# Patient Record
Sex: Male | Born: 1985 | Race: Black or African American | Hispanic: No | Marital: Single | Smoking: Current every day smoker
Health system: Southern US, Community
[De-identification: ages and names within clinical notes are randomized; demographics above are authoritative.]

## PROBLEM LIST (undated history)

## (undated) DIAGNOSIS — I10 Essential (primary) hypertension: Secondary | ICD-10-CM

---

## 2010-01-16 ENCOUNTER — Emergency Department (HOSPITAL_COMMUNITY): Admission: EM | Admit: 2010-01-16 | Discharge: 2010-01-16 | Payer: Self-pay | Admitting: Family Medicine

## 2010-08-02 LAB — POCT RAPID STREP A (OFFICE): Streptococcus, Group A Screen (Direct): NEGATIVE

## 2010-08-02 LAB — CBC
HCT: 42.5 % (ref 39.0–52.0)
Hemoglobin: 14.8 g/dL (ref 13.0–17.0)
WBC: 14.2 10*3/uL — ABNORMAL HIGH (ref 4.0–10.5)

## 2010-08-02 LAB — DIFFERENTIAL
Eosinophils Relative: 1 % (ref 0–5)
Lymphocytes Relative: 31 % (ref 12–46)
Lymphs Abs: 4.4 10*3/uL — ABNORMAL HIGH (ref 0.7–4.0)
Monocytes Absolute: 1.4 10*3/uL — ABNORMAL HIGH (ref 0.1–1.0)
Monocytes Relative: 10 % (ref 3–12)
Neutro Abs: 8.3 10*3/uL — ABNORMAL HIGH (ref 1.7–7.7)

## 2010-08-02 LAB — COMPREHENSIVE METABOLIC PANEL
AST: 28 U/L (ref 0–37)
Albumin: 3.8 g/dL (ref 3.5–5.2)
BUN: 8 mg/dL (ref 6–23)
Calcium: 9.6 mg/dL (ref 8.4–10.5)
Chloride: 105 mEq/L (ref 96–112)
Creatinine, Ser: 1.07 mg/dL (ref 0.4–1.5)
Total Protein: 7.3 g/dL (ref 6.0–8.3)

## 2016-07-30 ENCOUNTER — Emergency Department
Admission: EM | Admit: 2016-07-30 | Discharge: 2016-07-30 | Disposition: A | Discharge status: Home / Self Care | Payer: Self-pay | Attending: Emergency Medicine | Admitting: Emergency Medicine

## 2016-07-30 DIAGNOSIS — R369 Urethral discharge, unspecified: Secondary | ICD-10-CM | POA: Insufficient documentation

## 2016-07-30 DIAGNOSIS — Z202 Contact with and (suspected) exposure to infections with a predominantly sexual mode of transmission: Secondary | ICD-10-CM | POA: Insufficient documentation

## 2016-07-30 DIAGNOSIS — R300 Dysuria: Secondary | ICD-10-CM | POA: Insufficient documentation

## 2016-07-30 LAB — URINALYSIS, COMPLETE (UACMP) WITH MICROSCOPIC
Bacteria, UA: NONE SEEN
Bilirubin Urine: NEGATIVE
Glucose, UA: NEGATIVE mg/dL
Hgb urine dipstick: NEGATIVE
Ketones, ur: NEGATIVE mg/dL
Nitrite: NEGATIVE
Protein, ur: NEGATIVE mg/dL
Specific Gravity, Urine: 1.024 (ref 1.005–1.030)
Squamous Epithelial / HPF: NONE SEEN
pH: 7 (ref 5.0–8.0)

## 2016-07-30 MED ORDER — AZITHROMYCIN 500 MG PO TABS
1000.0000 mg | ORAL_TABLET | Freq: Once | ORAL | Status: AC
  Administered 2016-07-30: 1000 mg via ORAL
  Filled 2016-07-30: qty 2

## 2016-07-30 MED ORDER — CEFTRIAXONE SODIUM 250 MG IJ SOLR
250.0000 mg | Freq: Once | INTRAMUSCULAR | Status: AC
  Administered 2016-07-30: 250 mg via INTRAMUSCULAR
  Filled 2016-07-30: qty 250

## 2016-07-30 MED ORDER — AZITHROMYCIN 500 MG PO TABS
ORAL_TABLET | ORAL | Status: AC
  Filled 2016-07-30: qty 2

## 2016-07-30 NOTE — ED Notes (Signed)
Pt states x 1-2 days burning while peeing and white discharge. States sexual partner told him that they had an STD and for pt to get checked out.   Pt ambulatory to toilet to collect urine specimen.

## 2016-07-30 NOTE — ED Triage Notes (Signed)
Pt c/o penile discharge, states someone he was with told him they had an STD but did not say which one

## 2016-07-31 LAB — CHLAMYDIA/NGC RT PCR (ARMC ONLY)
Chlamydia Tr: NOT DETECTED
N gonorrhoeae: NOT DETECTED

## 2016-08-01 NOTE — ED Provider Notes (Signed)
[facility] Emergency Department Provider Note  ____________________________________________  Time seen: Approximately 12:34 AM  I have reviewed the triage vital signs and the nursing notes.   HISTORY  Chief Complaint Exposure to STD    HPI Ricardo Caldwell is a 31 y.o. male presenting with penile discharge and dysuria for the past 2 days. Patient has a history of sexual promiscuity and unprotected sex. Patient has been previously diagnosed with Chlamydia. He denies fever, bilateral flank pain, hematuria and history of pyelonephritis. No alleviating measures have been attempted. Patient denies chest pain, chest tightness, nausea, abdominal pain and vomiting.   History reviewed. No pertinent past medical history.  There are no active problems to display for this patient.   History reviewed. No pertinent surgical history.  Prior to Admission medications   Not on File    Allergies Patient has no known allergies.  No family history on file.  Social History Social History  Substance Use Topics  . Smoking status: Never Smoker  . Smokeless tobacco: Never Used  . Alcohol use No     Review of Systems  Constitutional: No fever/chills Eyes: No visual changes. No discharge ENT: No upper respiratory complaints. Cardiovascular: no chest pain. Respiratory: no cough. No SOB. Gastrointestinal: No abdominal pain.  No nausea, no vomiting.  No diarrhea. No constipation. Genitourinary: Patient has dysuria and penile discharge. Musculoskeletal: Negative for musculoskeletal pain. Skin: Negative for rash, abrasions, lacerations, ecchymosis. Neurological: Negative for headaches, focal weakness or numbness.   ____________________________________________   PHYSICAL EXAM:  VITAL SIGNS: ED Triage Vitals [07/30/16 1755]  Enc Vitals Group     BP (!) 151/84     Pulse Rate 64     Resp 18     Temp 98.5 F (36.9 C)     Temp Source Oral     SpO2 98 %      Weight 250 lb (113.4 kg)     Height 5\' 10"  (1.778 m)     Head Circumference      Peak Flow      Pain Score 0     Pain Loc      Pain Edu?      Excl. in GC?      Constitutional: Alert and oriented. Well appearing and in no acute distress. Eyes: Conjunctivae are normal. PERRL. EOMI. Head: Atraumatic. Cardiovascular: Normal rate, regular rhythm. Normal S1 and S2.  Good peripheral circulation. Respiratory: Normal respiratory effort without tachypnea or retractions. Lungs CTAB. Good air entry to the bases with no decreased or absent breath sounds. Gastrointestinal: Bowel sounds 4 quadrants. Soft and nontender to palpation. No guarding or rigidity. No palpable masses. No distention. No CVA tenderness. Genitourinary:  No erythema of the urethral meatus. No strictures. Musculoskeletal: Full range of motion to all extremities. No gross deformities appreciated. Neurologic:  Normal speech and language. No gross focal neurologic deficits are appreciated.  Skin:  Skin is warm, dry and intact. No rash noted. Psychiatric: Mood and affect are normal. Speech and behavior are normal. Patient exhibits appropriate insight and judgement.   ____________________________________________   LABS (all labs ordered are listed, but only abnormal results are displayed)  Labs Reviewed  URINALYSIS, COMPLETE (UACMP) WITH MICROSCOPIC - Abnormal; Notable for the following:       Result Value   Color, Urine YELLOW (*)    APPearance HAZY (*)    Leukocytes, UA MODERATE (*)    All other components within normal limits  CHLAMYDIA/NGC RT PCR ([facility] ONLY)  ____________________________________________  EKG   ____________________________________________  RADIOLOGY   No results found.  ____________________________________________    PROCEDURES  Procedure(s) performed:    Procedures    Medications  cefTRIAXone (ROCEPHIN) injection 250 mg (250 mg Intramuscular Given 07/30/16 2008)   azithromycin (ZITHROMAX) tablet 1,000 mg (1,000 mg Oral Given 07/30/16 2008)     ____________________________________________   INITIAL IMPRESSION / ASSESSMENT AND PLAN / ED COURSE  Pertinent labs & imaging results that were available during my care of the patient were reviewed by me and considered in my medical decision making (see chart for details).  Review of the [place] CSRS was performed in accordance of the [facility] prior to dispensing any controlled drugs.     Assessment and plan: STD exposure: Patient presents to the emergency department with dysuria and penile discharge. Patient has a history of sexual promiscuity and unprotected sex. Patient education was provided in the emergency department regarding safe sex practices. Patient was treated empirically with azithromycin and ceftriaxone. Patient was advised to follow-up at the health department for additional STD testing. All patient questions were answered.     ____________________________________________  FINAL CLINICAL IMPRESSION(S) / ED DIAGNOSES  Final diagnoses:  STD exposure      NEW MEDICATIONS STARTED DURING THIS VISIT:  There are no discharge medications for this patient.       This chart was dictated using voice recognition software/Dragon. Despite best efforts to proofread, errors can occur which can change the meaning. Any change was purely unintentional.    Orvil FeilJaclyn M Tyyonna Soucy, PA-C 08/01/16 [id number]    Phineas SemenGraydon Goodman, MD 08/02/16 [phone]

## 2016-12-05 ENCOUNTER — Encounter (HOSPITAL_COMMUNITY): Payer: Self-pay | Admitting: Emergency Medicine

## 2016-12-05 ENCOUNTER — Emergency Department (HOSPITAL_COMMUNITY)
Admission: EM | Admit: 2016-12-05 | Discharge: 2016-12-05 | Disposition: A | Discharge status: Home / Self Care | Payer: Self-pay | Attending: Emergency Medicine | Admitting: Emergency Medicine

## 2016-12-05 DIAGNOSIS — R300 Dysuria: Secondary | ICD-10-CM | POA: Insufficient documentation

## 2016-12-05 DIAGNOSIS — F17200 Nicotine dependence, unspecified, uncomplicated: Secondary | ICD-10-CM | POA: Insufficient documentation

## 2016-12-05 DIAGNOSIS — Z711 Person with feared health complaint in whom no diagnosis is made: Secondary | ICD-10-CM | POA: Insufficient documentation

## 2016-12-05 LAB — URINALYSIS, ROUTINE W REFLEX MICROSCOPIC
BILIRUBIN URINE: NEGATIVE
Glucose, UA: NEGATIVE mg/dL
Hgb urine dipstick: NEGATIVE
Ketones, ur: NEGATIVE mg/dL
NITRITE: NEGATIVE
PH: 6 (ref 5.0–8.0)
Protein, ur: NEGATIVE mg/dL
SPECIFIC GRAVITY, URINE: 1.008 (ref 1.005–1.030)

## 2016-12-05 MED ORDER — AZITHROMYCIN 250 MG PO TABS
1000.0000 mg | ORAL_TABLET | Freq: Once | ORAL | Status: AC
  Administered 2016-12-05: 1000 mg via ORAL
  Filled 2016-12-05: qty 4

## 2016-12-05 MED ORDER — CEFTRIAXONE SODIUM 250 MG IJ SOLR
250.0000 mg | Freq: Once | INTRAMUSCULAR | Status: AC
  Administered 2016-12-05: 250 mg via INTRAMUSCULAR
  Filled 2016-12-05: qty 250

## 2016-12-05 MED ORDER — DOXYCYCLINE HYCLATE 100 MG PO CAPS: 100.0000 mg | ORAL_CAPSULE | Freq: Two times a day (BID) | ORAL | 0 refills | Status: DC

## 2016-12-05 MED ORDER — LIDOCAINE HCL 1 % IJ SOLN
INTRAMUSCULAR | Status: AC
Start: 2016-12-05 — End: 2016-12-05
  Administered 2016-12-05: 20 mL
  Filled 2016-12-05: qty 20

## 2016-12-05 NOTE — ED Triage Notes (Signed)
Pt comes in with complaints of burning upon urination and his stomach being upset.  Pt reports hx of UTI and this feels similar. Reports no changes in the color of his urine. Pt also reports having sex recently (hx of STI's) but was wearing a condom so is doubtful that is related.

## 2016-12-05 NOTE — ED Provider Notes (Signed)
[facility]-EMERGENCY DEPT Provider Note   CSN: [id number] Arrival date & time: 12/05/16  1331     History   Chief Complaint Chief Complaint  Patient presents with  . Dysuria    HPI Ricardo AndersonMichael Caldwell is a 31 y.o. male.  HPI   31 year old male presents with urinary discomfort. Patient states he had protected sex 3 days ago with the opposite sex. Yesterday he noticed some mild urinary discomfort with tingling sensation at the tip of his penis and some mild discharge. Does report some vague abdominal discomfort but states symptom is mild. He felt that he may have a urinary tract infection and would like to be tested for that. He also would like to be tested for gonorrhea and chlamydia as he has history in the past. He does not want to be tested for HIV and syphilis. He has no known drug allergies. He has had 3 separate sexual partners within the past 6 months. Currently denies having fever, chills, back pain, testicular pain, scrotal swelling, rectal pain, or rash. He denies any specific treatment tried.  History reviewed. No pertinent past medical history.  There are no active problems to display for this patient.   History reviewed. No pertinent surgical history.     Home Medications    Prior to Admission medications   Not on File    Family History No family history on file.  Social History Social History  Substance Use Topics  . Smoking status: Current Every Day Smoker    Packs/day: 0.50  . Smokeless tobacco: Never Used  . Alcohol use No     Allergies   Patient has no known allergies.   Review of Systems Review of Systems  Constitutional: Negative for fever.  Genitourinary: Positive for discharge and dysuria. Negative for penile pain, penile swelling, scrotal swelling and testicular pain.  Musculoskeletal: Negative for back pain.  Neurological: Negative for numbness and headaches.     Physical Exam Updated Vital Signs BP (!) 158/95 (BP Location: Right Arm)    Pulse 74   Temp 98.6 F (37 C) (Oral)   Resp 20   Ht 5\' 10"  (1.778 m)   Wt 97.5 kg (215 lb)   SpO2 99%   BMI 30.85 kg/m   Physical Exam  Constitutional: He appears well-developed and well-nourished. No distress.  HENT:  Head: Atraumatic.  Eyes: Conjunctivae are normal.  Neck: Neck supple.  Abdominal: Soft. He exhibits no distension. There is no tenderness.  Genitourinary:  Genitourinary Comments: Chaperone present during exam. No inguinal lymph biopsy or inguinal hernia noted. Normal  uncircumcised penis free of lesion or rash. testicle is normal with normal lie, nontender. Normal scrotum. Perineum is soft and nontender.   Neurological: He is alert.  Skin: No rash noted.  Psychiatric: He has a normal mood and affect.  Nursing note and vitals reviewed.    ED Treatments / Results  Labs (all labs ordered are listed, but only abnormal results are displayed) Labs Reviewed  URINALYSIS, ROUTINE W REFLEX MICROSCOPIC - Abnormal; Notable for the following:       Result Value   Leukocytes, UA SMALL (*)    Bacteria, UA FEW (*)    Squamous Epithelial / LPF 0-5 (*)    All other components within normal limits    EKG  EKG Interpretation None       Radiology No results found.  Procedures Procedures (including critical care time)  Medications Ordered in ED Medications  cefTRIAXone (ROCEPHIN) injection 250 mg (not administered)  azithromycin (ZITHROMAX) tablet 1,000 mg (not administered)     Initial Impression / Assessment and Plan / ED Course  I have reviewed the triage vital signs and the nursing notes.  Pertinent labs & imaging results that were available during my care of the patient were reviewed by me and considered in my medical decision making (see chart for details).     BP (!) 158/95 (BP Location: Right Arm)   Pulse 74   Temp 98.6 F (37 C) (Oral)   Resp 20   Ht 5\' 10"  (1.778 m)   Wt 97.5 kg (215 lb)   SpO2 99%   BMI 30.85 kg/m    Final Clinical  Impressions(s) / ED Diagnoses   Final diagnoses:  Dysuria  Concern about STD in male without diagnosis    New Prescriptions New Prescriptions   DOXYCYCLINE (VIBRAMYCIN) 100 MG CAPSULE    Take 1 capsule (100 mg total) by mouth 2 (two) times daily. One po bid x 7 days   2:59 PM Pt here with concern for potential STI after he developed dysuria and penile discharge.  Pt would like to be tested for GC/Ch but does not want to be tested for HIV/Syphillis.  He has a benign abdominal exam.    3:13 PM Patient has a normal genital exam. GC and Chlamydia is obtained. Patient given Rocephin and Zithromax. Since he endorsed dysuria with frequency and urgency and therefore I will prescribe doxycycline to treat for potential UTI as well. Encouraged patient to notify partner if tested positive for infection.   Fayrene Helper, PA-C 12/05/16 1528    Shaune Pollack, MD 12/06/16 [phone]

## 2016-12-05 NOTE — ED Notes (Signed)
Waited 30 minutes after administration of IM injection.  Verbalized understanding of discharge instructions.

## 2016-12-05 NOTE — Discharge Instructions (Signed)
Take antibiotic as prescribed for the dull duration.  Avoid sexual activities until your condition resolved.  Notify partner if you tested positive for infection.

## 2016-12-08 LAB — GC/CHLAMYDIA PROBE AMP (~~LOC~~) NOT AT ARMC
Chlamydia: NEGATIVE
NEISSERIA GONORRHEA: NEGATIVE

## 2018-02-13 ENCOUNTER — Other Ambulatory Visit: Payer: Self-pay

## 2018-02-13 ENCOUNTER — Emergency Department (HOSPITAL_COMMUNITY)
Admission: EM | Admit: 2018-02-13 | Discharge: 2018-02-13 | Disposition: A | Discharge status: Home / Self Care | Payer: Self-pay | Attending: Emergency Medicine | Admitting: Emergency Medicine

## 2018-02-13 ENCOUNTER — Encounter (HOSPITAL_COMMUNITY): Payer: Self-pay | Admitting: *Deleted

## 2018-02-13 DIAGNOSIS — I10 Essential (primary) hypertension: Secondary | ICD-10-CM | POA: Insufficient documentation

## 2018-02-13 DIAGNOSIS — G4489 Other headache syndrome: Secondary | ICD-10-CM | POA: Insufficient documentation

## 2018-02-13 DIAGNOSIS — F17210 Nicotine dependence, cigarettes, uncomplicated: Secondary | ICD-10-CM | POA: Insufficient documentation

## 2018-02-13 LAB — I-STAT CHEM 8, ED
BUN: 9 mg/dL (ref 6–20)
Calcium, Ion: 1.1 mmol/L — ABNORMAL LOW (ref 1.15–1.40)
Chloride: 103 mmol/L (ref 98–111)
Creatinine, Ser: 1 mg/dL (ref 0.61–1.24)
Glucose, Bld: 95 mg/dL (ref 70–99)
HCT: 42 % (ref 39.0–52.0)
Hemoglobin: 14.3 g/dL (ref 13.0–17.0)
Potassium: 3.5 mmol/L (ref 3.5–5.1)
Sodium: 141 mmol/L (ref 135–145)
TCO2: 29 mmol/L (ref 22–32)

## 2018-02-13 MED ORDER — HYDROCHLOROTHIAZIDE 12.5 MG PO TABS: 12.5000 mg | ORAL_TABLET | Freq: Every day | ORAL | 0 refills | Status: DC

## 2018-02-13 MED ORDER — IBUPROFEN 200 MG PO TABS
400.0000 mg | ORAL_TABLET | Freq: Once | ORAL | Status: AC
  Administered 2018-02-13: 400 mg via ORAL
  Filled 2018-02-13: qty 2

## 2018-02-13 NOTE — ED Triage Notes (Signed)
Pt reports he has had headache and dizziness for about 1 week.

## 2018-02-13 NOTE — ED Notes (Signed)
Patient states he has been having dizzy spells with near syncopal episodes with an associated headache to bilateral temples for about 1 week. Patient states he is a single father and he didn't want to pass out with his daughter.

## 2018-02-13 NOTE — ED Provider Notes (Signed)
[facility]-EMERGENCY DEPT Provider Note   CSN: [id number] Arrival date & time: 02/13/18  0257     History   Chief Complaint Chief Complaint  Patient presents with  . Headache    HPI Ricardo Caldwell is a 32 y.o. male.  The history is provided by the patient.  Headache   This is a new problem. The current episode started more than 2 days ago. The problem occurs constantly. The problem has not changed since onset.The pain is located in the left unilateral and right unilateral region. The pain is moderate. The pain does not radiate. Associated symptoms include nausea. Pertinent negatives include no fever and no vomiting. He has tried nothing for the symptoms.   Patient presents with headache.  He reports for the past week he had gradual onset of headache.  No fevers or vomiting.  He did report some lightheadedness, but no syncope.  No focal weakness. No recent falls or injuries.   PMH-none Soc hx - occasional smoker Home Medications    Prior to Admission medications   Medication Sig Start Date End Date Taking? Authorizing Provider  hydrochlorothiazide (HYDRODIURIL) 12.5 MG tablet Take 1 tablet (12.5 mg total) by mouth daily. 02/13/18   Zadie Rhine, MD    Family History No family history on file.  Social History Social History   Tobacco Use  . Smoking status: Current Every Day Smoker    Packs/day: 0.50  . Smokeless tobacco: Never Used  Substance Use Topics  . Alcohol use: No  . Drug use: Not on file     Allergies   Patient has no known allergies.   Review of Systems Review of Systems  Constitutional: Negative for fever.  Gastrointestinal: Positive for nausea. Negative for vomiting.  Neurological: Positive for light-headedness and headaches. Negative for weakness.  All other systems reviewed and are negative.    Physical Exam Updated Vital Signs BP (!) 149/114 (BP Location: Right Arm)   Pulse 70   Temp 97.9 F (36.6 C) (Oral)    Resp 16   Ht 1.829 m (6')   Wt 102.1 kg   SpO2 100%   BMI 30.52 kg/m   Physical Exam CONSTITUTIONAL: Well developed/well nourished HEAD: Normocephalic/atraumatic EYES: EOMI/PERRL ENMT: Mucous membranes moist NECK: supple no meningeal signs CV: S1/S2 noted, no murmurs/rubs/gallops noted LUNGS: Lungs are clear to auscultation bilaterally, no apparent distress ABDOMEN: soft, nontender NEURO: Pt is awake/alert/appropriate, moves all extremitiesx4.  No facial droop.   EXTREMITIES: pulses normal/equal, full ROM SKIN: warm, color normal PSYCH: no abnormalities of mood noted, alert and oriented to situation   ED Treatments / Results  Labs (all labs ordered are listed, but only abnormal results are displayed) Labs Reviewed  I-STAT CHEM 8, ED - Abnormal; Notable for the following components:      Result Value   Calcium, Ion 1.10 (*)    All other components within normal limits    EKG None  Radiology No results found.  Procedures Procedures    Medications Ordered in ED Medications  ibuprofen (ADVIL,MOTRIN) tablet 400 mg (400 mg Oral Given 02/13/18 0521)     Initial Impression / Assessment and Plan / ED Course  I have reviewed the triage vital signs and the nursing notes.  Pertinent labs results that were available during my care of the patient were reviewed by me and considered in my medical decision making (see chart for details).     5:52 AM Patient reports had a headache for about a week.  It was very gradual in onset type headache, with associated lightheadedness No focal weakness.  He is in no distress.  He reports he has been told previously that he has high blood pressure, but not on current medications.  After long discussion with patient, likely that his headache is due to elevated blood pressure.  He has no follow-up plan in place, and he is interested in starting medications.  Labs drawn were unremarkable, therefore we will start HCTZ for 2 weeks. He has been  referred to [facility] health/wellness. PT feels comfortable with plan. Final Clinical Impressions(s) / ED Diagnoses   Final diagnoses:  Other headache syndrome  Essential hypertension    ED Discharge Orders         Ordered    hydrochlorothiazide (HYDRODIURIL) 12.5 MG tablet  Daily     02/13/18 0541           Zadie Rhine, MD 02/13/18 [phone]

## 2018-04-23 ENCOUNTER — Emergency Department
Admission: EM | Admit: 2018-04-23 | Discharge: 2018-04-23 | Disposition: A | Discharge status: Home / Self Care | Payer: Self-pay | Attending: Emergency Medicine | Admitting: Emergency Medicine

## 2018-04-23 DIAGNOSIS — B349 Viral infection, unspecified: Secondary | ICD-10-CM | POA: Insufficient documentation

## 2018-04-23 DIAGNOSIS — Z79899 Other long term (current) drug therapy: Secondary | ICD-10-CM | POA: Insufficient documentation

## 2018-04-23 DIAGNOSIS — R42 Dizziness and giddiness: Secondary | ICD-10-CM | POA: Insufficient documentation

## 2018-04-23 LAB — GLUCOSE, CAPILLARY: Glucose-Capillary: 130 mg/dL — ABNORMAL HIGH (ref 70–99)

## 2018-04-23 LAB — CBC
HEMATOCRIT: 42.8 % (ref 39.0–52.0)
Hemoglobin: 13.7 g/dL (ref 13.0–17.0)
MCH: 28.2 pg (ref 26.0–34.0)
MCHC: 32 g/dL (ref 30.0–36.0)
MCV: 88.2 fL (ref 80.0–100.0)
NRBC: 0 % (ref 0.0–0.2)
PLATELETS: 331 10*3/uL (ref 150–400)
RBC: 4.85 MIL/uL (ref 4.22–5.81)
RDW: 13.3 % (ref 11.5–15.5)
WBC: 12.2 10*3/uL — ABNORMAL HIGH (ref 4.0–10.5)

## 2018-04-23 LAB — BASIC METABOLIC PANEL
ANION GAP: 6 (ref 5–15)
BUN: 10 mg/dL (ref 6–20)
CALCIUM: 8.6 mg/dL — AB (ref 8.9–10.3)
CHLORIDE: 106 mmol/L (ref 98–111)
CO2: 28 mmol/L (ref 22–32)
Creatinine, Ser: 1.04 mg/dL (ref 0.61–1.24)
GFR calc non Af Amer: >60 mL/min (ref >60)
Glucose, Bld: 130 mg/dL — ABNORMAL HIGH (ref 70–99)
Potassium: 3.4 mmol/L — ABNORMAL LOW (ref 3.5–5.1)
Sodium: 140 mmol/L (ref 135–145)

## 2018-04-23 NOTE — ED Provider Notes (Signed)
[facility] Emergency Department Provider Note  ____________________________________________   First MD Initiated Contact with Patient 04/23/18 [phone]     (approximate)  I have reviewed the triage vital signs and the nursing notes.   HISTORY  Chief Complaint Dizziness    HPI Ricardo Caldwell is a 32 y.o. male who denies any chronic medical issues who presents by private vehicle for evaluation of about 3 weeks or longer of dizziness.  He feels it mostly when he stands up too suddenly and is acute in onset and can be severe but resolves after a few seconds.  Nothing particular makes it better.  He says that over the last week or so he is also had some general malaise, generalized body aches, nasal congestion, and mild cough.  No difficulty breathing.  Denies chest pain, nausea, vomiting, and abdominal pain.  He is not having any difficulty with ambulation and after he stands that if he remains still for a short period of time that he feels better.  He does not have a primary care doctor and has not seen anybody about this.  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  He has no difficulty with coordination.  History reviewed. No pertinent past medical history.  There are no active problems to display for this patient.   History reviewed. No pertinent surgical history.  Prior to Admission medications   Medication Sig Start Date End Date Taking? Authorizing Provider  hydrochlorothiazide (HYDRODIURIL) 12.5 MG tablet Take 1 tablet (12.5 mg total) by mouth daily. 02/13/18   Zadie Rhine, MD    Allergies Patient has no known allergies.  No family history on file.  Social History Social History   Tobacco Use  . Smoking status: Current Every Day Smoker    Packs/day: 0.50  . Smokeless tobacco: Never Used  Substance Use Topics  . Alcohol use: No  . Drug use: Not on file    Review of Systems Constitutional: No fever/chills Eyes: No  visual changes. ENT: No sore throat.  Nasal congestion and runny nose. Cardiovascular: Denies chest pain. Respiratory: Denies shortness of breath.  Mild recent cough. Gastrointestinal: No abdominal pain.  No nausea, no vomiting.  No diarrhea.  No constipation. Genitourinary: Negative for dysuria. Musculoskeletal: Negative for neck pain.  Negative for back pain. Integumentary: Negative for rash. Neurological: Dizziness upon standing for 3 or more weeks.  Negative for headaches, focal weakness or numbness.   ____________________________________________   PHYSICAL EXAM:  VITAL SIGNS: ED Triage Vitals  Enc Vitals Group     BP 04/23/18 0026 (!) 142/76     Pulse Rate 04/23/18 0026 66     Resp 04/23/18 0026 18     Temp 04/23/18 0026 98.4 F (36.9 C)     Temp Source 04/23/18 0026 Oral     SpO2 04/23/18 0026 97 %     Weight 04/23/18 0024 102 kg (224 lb 13.9 oz)     Height --      Head Circumference --      Peak Flow --      Pain Score 04/23/18 0024 0     Pain Loc --      Pain Edu? --      Excl. in GC? --     Constitutional: Alert and oriented. Well appearing and in no acute distress. Eyes: Conjunctivae are normal.  Head: Atraumatic. Nose: No congestion/rhinnorhea. Mouth/Throat: Mucous membranes are moist. Neck: No stridor.  No meningeal signs.  Cardiovascular: Normal rate, regular rhythm. Good peripheral circulation. Grossly normal heart sounds. Respiratory: Normal respiratory effort.  No retractions. Lungs CTAB. Gastrointestinal: Soft and nontender. No distention.  Musculoskeletal: No lower extremity tenderness nor edema. No gross deformities of extremities. Neurologic:  Normal speech and language. No gross focal neurologic deficits are appreciated.  Skin:  Skin is warm, dry and intact. No rash noted. Psychiatric: Mood and affect are normal. Speech and behavior are normal.  ____________________________________________   LABS (all labs ordered are listed, but only  abnormal results are displayed)  Labs Reviewed  BASIC METABOLIC PANEL - Abnormal; Notable for the following components:      Result Value   Potassium 3.4 (*)    Glucose, Bld 130 (*)    Calcium 8.6 (*)    All other components within normal limits  CBC - Abnormal; Notable for the following components:   WBC 12.2 (*)    All other components within normal limits  GLUCOSE, CAPILLARY - Abnormal; Notable for the following components:   Glucose-Capillary 130 (*)    All other components within normal limits  URINALYSIS, COMPLETE (UACMP) WITH MICROSCOPIC  CBG MONITORING, ED   ____________________________________________  EKG  ED ECG REPORT I, Loleta Roseory Dickey Caamano, the attending physician, personally viewed and interpreted this ECG.  Date: 04/23/2018 EKG Time: 00: 34 Rate: 65 Rhythm: normal sinus rhythm QRS Axis: normal Intervals: Borderline LVH ST/T Wave abnormalities: normal Narrative Interpretation: no evidence of acute ischemia  ____________________________________________  RADIOLOGY   ED MD interpretation: No indication for imaging  Official radiology report(s): No results found.  ____________________________________________   PROCEDURES  Critical Care performed: No   Procedure(s) performed:   Procedures   ____________________________________________   INITIAL IMPRESSION / ASSESSMENT AND PLAN / ED COURSE  As part of my medical decision making, I reviewed the following data within the electronic MEDICAL RECORD NUMBER Nursing notes reviewed and incorporated, Labs reviewed , EKG interpreted  and Notes from prior ED visits    Differential diagnosis includes, but is not limited to, orthostatic hypotension, dehydration, electrolyte abnormality, less likely CVA.  The patient has had symptoms for an extended period of time and they sound orthostatic.  He is eating and drinking normally and does not have any other symptoms except for some recent viral symptoms including a mild  cough and nasal congestion.  He is well-appearing and in no distress.  Is ambulatory without any difficulty and is carrying his infant daughter with no difficulty or distress.  He has no focal neurological deficits.    I encouraged him to follow-up as an outpatient, stay hydrated with plenty of fluids, and to return with any worsening symptoms.  His lab work is reassuring with an essentially normal basic metabolic panel, normal CBC except for a very mild leukocytosis, and normal vital signs.  I gave my usual customary return precautions.     ____________________________________________  FINAL CLINICAL IMPRESSION(S) / ED DIAGNOSES  Final diagnoses:  Dizziness  Viral illness     MEDICATIONS GIVEN DURING THIS VISIT:  Medications - No data to display   ED Discharge Orders    None       Note:  This document was prepared using Dragon voice recognition software and may include unintentional dictation errors.    Loleta RoseForbach, Tykia Mellone, MD 04/23/18 [phone]

## 2018-04-23 NOTE — Discharge Instructions (Addendum)
You have been seen today in the Emergency Department (ED)  for dizziness.  Your symptoms may be due to dehydration, so it is important that you drink plenty of non-alcoholic fluids.  Please call your regular doctor as soon as possible to schedule the next available clinic appointment to follow up with him/her regarding your visit to the ED and your symptoms.  Return to the Emergency Department (ED)  if you have any episodes of passing out or develop ANY chest pain, pressure, tightness, trouble breathing, sudden sweating, or other symptoms that concern you.

## 2018-04-23 NOTE — ED Triage Notes (Signed)
Patient c/o dizziness, light-headedness, and near syncope. Patient states symptoms have been "for a while".

## 2018-04-23 NOTE — ED Notes (Signed)
Pt lying on stretcher in exam room with eyes closed, no distress noted; reports dizziness "for awhile"; denies any recent illness; denies pain; resp even/unlab, lungs clear, apical audible & regular

## 2018-04-27 ENCOUNTER — Encounter (HOSPITAL_COMMUNITY): Payer: Self-pay | Admitting: Emergency Medicine

## 2018-04-27 ENCOUNTER — Other Ambulatory Visit: Payer: Self-pay

## 2018-04-27 DIAGNOSIS — E42 Marasmic kwashiorkor: Secondary | ICD-10-CM | POA: Insufficient documentation

## 2018-04-27 DIAGNOSIS — Z79899 Other long term (current) drug therapy: Secondary | ICD-10-CM | POA: Insufficient documentation

## 2018-04-27 DIAGNOSIS — F17290 Nicotine dependence, other tobacco product, uncomplicated: Secondary | ICD-10-CM | POA: Insufficient documentation

## 2018-04-27 LAB — CBC
HCT: 45.6 % (ref 39.0–52.0)
Hemoglobin: 14.8 g/dL (ref 13.0–17.0)
MCH: 28.6 pg (ref 26.0–34.0)
MCHC: 32.5 g/dL (ref 30.0–36.0)
MCV: 88 fL (ref 80.0–100.0)
Platelets: 365 10*3/uL (ref 150–400)
RBC: 5.18 MIL/uL (ref 4.22–5.81)
RDW: 13.2 % (ref 11.5–15.5)
WBC: 11 10*3/uL — ABNORMAL HIGH (ref 4.0–10.5)
nRBC: 0 % (ref 0.0–0.2)

## 2018-04-27 LAB — BASIC METABOLIC PANEL
Anion gap: 9 (ref 5–15)
BUN: 10 mg/dL (ref 6–20)
CO2: 26 mmol/L (ref 22–32)
Calcium: 8.9 mg/dL (ref 8.9–10.3)
Chloride: 104 mmol/L (ref 98–111)
Creatinine, Ser: 0.99 mg/dL (ref 0.61–1.24)
GFR calc Af Amer: >60 mL/min (ref >60)
GFR calc non Af Amer: >60 mL/min (ref >60)
Glucose, Bld: 91 mg/dL (ref 70–99)
Potassium: 3.3 mmol/L — ABNORMAL LOW (ref 3.5–5.1)
Sodium: 139 mmol/L (ref 135–145)

## 2018-04-27 NOTE — ED Triage Notes (Signed)
Pt reports feeling of dizziness that has been occurring for several months. Pt reports new onset of blood pressure being elevated.

## 2018-04-28 ENCOUNTER — Emergency Department (HOSPITAL_COMMUNITY)
Admission: EM | Admit: 2018-04-28 | Discharge: 2018-04-28 | Disposition: A | Discharge status: Home / Self Care | Payer: Self-pay | Attending: Emergency Medicine | Admitting: Emergency Medicine

## 2018-04-28 DIAGNOSIS — R42 Dizziness and giddiness: Secondary | ICD-10-CM

## 2018-04-28 LAB — CBG MONITORING, ED: Glucose-Capillary: 92 mg/dL (ref 70–99)

## 2018-04-28 NOTE — ED Provider Notes (Signed)
TIME SEEN: 2:05 AM  CHIEF COMPLAINT: Dizziness  HPI: Patient is a 32 year old male with no significant past medical history who presents to the emergency department with 2 months of lightheadedness mostly with standing.  Describes it as feeling like he may pass out but he has never lost consciousness.  He denies vertiginous symptoms.  No associated chest pain, shortness of breath, recent vomiting or diarrhea, fever, bloody stool or melena.  No numbness or focal weakness.  Able to ambulate.  ROS: See HPI Constitutional: no fever  Eyes: no drainage  ENT: no runny nose   Cardiovascular:  no chest pain  Resp: no SOB  GI: no vomiting GU: no dysuria Integumentary: no rash  Allergy: no hives  Musculoskeletal: no leg swelling  Neurological: no slurred speech ROS otherwise negative  PAST MEDICAL HISTORY/PAST SURGICAL HISTORY:  History reviewed. No pertinent past medical history.  MEDICATIONS:  Prior to Admission medications   Medication Sig Start Date End Date Taking? Authorizing Provider  hydrochlorothiazide (HYDRODIURIL) 12.5 MG tablet Take 1 tablet (12.5 mg total) by mouth daily. 02/13/18  Yes Zadie Rhine, MD    ALLERGIES:  No Known Allergies  SOCIAL HISTORY:  Social History   Tobacco Use  . Smoking status: Current Every Day Smoker    Packs/day: 0.50    Types: Cigars  . Smokeless tobacco: Never Used  Substance Use Topics  . Alcohol use: No    FAMILY HISTORY: History reviewed. No pertinent family history.  EXAM: BP (!) 156/99 (BP Location: Right Arm)   Pulse 62   Temp 98.5 F (36.9 C) (Oral)   Resp 13   Ht 5\' 10"  (1.778 m)   Wt 102.1 kg   SpO2 99%   BMI 32.28 kg/m  CONSTITUTIONAL: Alert and oriented and responds appropriately to questions. Well-appearing; well-nourished HEAD: Normocephalic EYES: Conjunctivae clear, pupils appear equal, EOMI ENT: normal nose; moist mucous membranes NECK: Supple, no meningismus, no nuchal rigidity, no LAD  CARD: RRR; S1 and  S2 appreciated; no murmurs, no clicks, no rubs, no gallops RESP: Normal chest excursion without splinting or tachypnea; breath sounds clear and equal bilaterally; no wheezes, no rhonchi, no rales, no hypoxia or respiratory distress, speaking full sentences ABD/GI: Normal bowel sounds; non-distended; soft, non-tender, no rebound, no guarding, no peritoneal signs, no hepatosplenomegaly BACK:  The back appears normal and is non-tender to palpation, there is no CVA tenderness EXT: Normal ROM in all joints; non-tender to palpation; no edema; normal capillary refill; no cyanosis, no calf tenderness or swelling    SKIN: Normal color for age and race; warm; no rash NEURO: Moves all extremities equally, normal speech, no facial asymmetry, normal gait PSYCH: The patient's mood and manner are appropriate. Grooming and personal hygiene are appropriate.  MEDICAL DECISION MAKING: Patient here with ongoing symptoms of lightheadedness mostly with standing.  Suspect this is orthostasis.  Labs here are unremarkable.  Normal blood glucose, electrolytes, hemoglobin.  EKG shows no ischemic abnormality, arrhythmia or interval changes.  He has no focal neurologic deficits.  It appears he has been seen at [facility] emergency department for similar symptoms on December 6 and had a negative work-up there as well.  Have encouraged him to follow-up with her primary care provider.  Have encouraged him to increase his fluid intake, avoid caffeine and alcohol.   At this time, I do not feel there is any life-threatening condition present. I have reviewed and discussed all results (EKG, imaging, lab, urine as appropriate) and exam findings with patient/family.  I have reviewed nursing notes and appropriate previous records.  I feel the patient is safe to be discharged home without further emergent workup and can continue workup as an outpatient as needed. Discussed usual and customary return precautions. Patient/family verbalize  understanding and are comfortable with this plan.  Outpatient follow-up has been provided as needed. All questions have been answered.     EKG Interpretation  Date/Time:  Tuesday April 27 2018 22:37:31 EST Ventricular Rate:  64 PR Interval:    QRS Duration: 101 QT Interval:  390 QTC Calculation: 403 R Axis:   59 Text Interpretation:  Sinus rhythm Left atrial enlargement ST elev, probable normal early repol pattern No significant change since last tracing Confirmed by Ward, Baxter HireKristen [phone]([id number]) on 04/27/2018 11:45:05 PM         Ward, Layla MawKristen N, DO 04/28/18 [id number]

## 2018-04-28 NOTE — Discharge Instructions (Signed)
Steps to find a Primary Care Provider (PCP): ° °Call [phone] or [phone] to access "[place] Find a Doctor Service." ° °2.  You may also go on the [facility] website at [email or web address] ° °3.  [facility] also frequently accepts new patients. ° °[facility]  °[address] °[place] [address] °[phone] ° °4.  There are also multiple [facility], [facility] and [facility] practices throughout the [place] that are frequently accepting new patients. You may find a clinic that is close to your home and contact them. ° °[facility] °[email or web address] °[phone] ° °[facility] °[email or web address] ° °[facility] °[email or web address] °[phone] ° °[facility] °[email or web address] °[phone] ° °5.  Local Health Departments also can provide primary care services. ° °[facility]  °[address] °[place] [address] [phone] ° °[facility] °[address] °[place] [address] [phone] ° °[facility] °[address]  °[place] [address] °[phone] ° ° °

## 2018-04-29 ENCOUNTER — Encounter: Payer: Self-pay | Admitting: Emergency Medicine

## 2018-04-29 ENCOUNTER — Emergency Department
Admission: EM | Admit: 2018-04-29 | Discharge: 2018-04-29 | Disposition: A | Discharge status: Home / Self Care | Payer: Self-pay | Attending: Emergency Medicine | Admitting: Emergency Medicine

## 2018-04-29 ENCOUNTER — Emergency Department: Payer: Self-pay

## 2018-04-29 DIAGNOSIS — R42 Dizziness and giddiness: Secondary | ICD-10-CM | POA: Insufficient documentation

## 2018-04-29 DIAGNOSIS — F17290 Nicotine dependence, other tobacco product, uncomplicated: Secondary | ICD-10-CM | POA: Insufficient documentation

## 2018-04-29 LAB — URINALYSIS, COMPLETE (UACMP) WITH MICROSCOPIC
Bacteria, UA: NONE SEEN
Bilirubin Urine: NEGATIVE
Glucose, UA: NEGATIVE mg/dL
Hgb urine dipstick: NEGATIVE
Ketones, ur: 5 mg/dL — AB
Leukocytes, UA: NEGATIVE
Nitrite: NEGATIVE
Protein, ur: 30 mg/dL — AB
Specific Gravity, Urine: 1.028 (ref 1.005–1.030)
pH: 6 (ref 5.0–8.0)

## 2018-04-29 MED ORDER — LORAZEPAM 1 MG PO TABS: 1.0000 mg | ORAL_TABLET | Freq: Two times a day (BID) | ORAL | 0 refills | Status: AC | PRN

## 2018-04-29 NOTE — ED Triage Notes (Addendum)
ARrives today c/o intermittent dizziness today.  STates "maybe its just a panic attack".  Patient states he is feeling a little dizzy currently.  Patient is AAOx3.  Skin warm and dry. NAD.  MAE equally and strong.  Gait steady.  Patient seen through ED 2 days ago for same complaint.  States he has been checking his blood pressure at home, but the blood pressure "changes all of the time".  Patient appears to be anxious.  Skin warm and dry.  States he is under a lot of pressure currently, going through some court things with his daughters mother.

## 2018-04-29 NOTE — ED Provider Notes (Signed)
[facility] Emergency Department Provider Note       Time seen: ----------------------------------------- 7:20 PM on 04/29/2018 -----------------------------------------   I have reviewed the triage vital signs and the nursing notes.  HISTORY   Chief Complaint Dizziness    HPI Ricardo AndersonMichael Caldwell is a 32 y.o. male with no significant past medical history who presents to the ED for intermittent dizziness today.  Patient thinks it may be a panic attack.  He has been feeling dizzy recently.  He was seen through the ER several days ago for similar complaint.  Reports his blood pressure has been fluctuating at home.  He reports to being under significant stress currently.  History reviewed. No pertinent past medical history.  There are no active problems to display for this patient.   No past surgical history on file.  Allergies Patient has no known allergies.  Social History Social History   Tobacco Use  . Smoking status: Current Every Day Smoker    Packs/day: 0.50    Types: Cigars  . Smokeless tobacco: Never Used  Substance Use Topics  . Alcohol use: No  . Drug use: Not on file   Review of Systems Constitutional: Negative for fever. Cardiovascular: Negative for chest pain. Respiratory: Negative for shortness of breath. Gastrointestinal: Negative for abdominal pain, vomiting and diarrhea. Musculoskeletal: Negative for back pain. Skin: Negative for rash. Neurological: Positive for dizziness  All systems negative/normal/unremarkable except as stated in the HPI  ____________________________________________   PHYSICAL EXAM:  VITAL SIGNS: ED Triage Vitals  Enc Vitals Group     BP 04/29/18 1640 (!) 169/97     Pulse Rate 04/29/18 1640 69     Resp 04/29/18 1640 16     Temp 04/29/18 1640 98 F (36.7 C)     Temp Source 04/29/18 1640 Oral     SpO2 04/29/18 1640 98 %     Weight 04/29/18 1637 224 lb 13.9 oz (102 kg)     Height 04/29/18 1637 5'  10" (1.778 m)     Head Circumference --      Peak Flow --      Pain Score 04/29/18 1637 0     Pain Loc --      Pain Edu? --      Excl. in GC? --    Constitutional: Alert and oriented. Well appearing and in no distress. ENT   Head: Normocephalic and atraumatic.   Nose: No congestion/rhinnorhea.   Mouth/Throat: Mucous membranes are moist.   Neck: No stridor. Cardiovascular: Normal rate, regular rhythm. No murmurs, rubs, or gallops. Respiratory: Normal respiratory effort without tachypnea nor retractions. Breath sounds are clear and equal bilaterally. No wheezes/rales/rhonchi. Musculoskeletal: Nontender with normal range of motion in extremities. No lower extremity tenderness nor edema. Neurologic:  Normal speech and language. No gross focal neurologic deficits are appreciated.  Skin:  Skin is warm, dry and intact. No rash noted. Psychiatric: Mood and affect are normal. Speech and behavior are normal.  ____________________________________________  EKG: Interpreted by me.  Sinus rhythm with a rate of 73 bpm, voltage criteria for LVH, normal QT, normal axis  ____________________________________________  ED COURSE:  As part of my medical decision making, I reviewed the following data within the electronic MEDICAL RECORD NUMBER History obtained from family if available, nursing notes, old chart and ekg, as well as notes from prior ED visits. Patient presented for dizziness, we will assess with labs and imaging as indicated at this time.   Procedures ____________________________________________  LABS (pertinent positives/negatives)  Labs Reviewed  URINALYSIS, COMPLETE (UACMP) WITH MICROSCOPIC - Abnormal; Notable for the following components:      Result Value   Color, Urine YELLOW (*)    APPearance CLEAR (*)    Ketones, ur 5 (*)    Protein, ur 30 (*)    All other components within normal limits    RADIOLOGY  Chest x-ray is  normal  ____________________________________________  DIFFERENTIAL DIAGNOSIS   Dehydration, electrolyte abnormality, arrhythmia, anxiety, orthostasis, arrhythmia  FINAL ASSESSMENT AND PLAN  Dizziness   Plan: The patient had presented for dizziness of uncertain etiology. Patient's labs are reassuring. Patient's imaging was unremarkable.  He does have possible voltage criteria for LVH.  I will advise close outpatient follow-up with cardiology and he has agreed to do this.  I will also write for anxiety medicines.  His recent work-ups here have been negative.   Ulice Dash, MD   Note: This note was generated in part or whole with voice recognition software. Voice recognition is usually quite accurate but there are transcription errors that can and very often do occur. I apologize for any typographical errors that were not detected and corrected.     Emily Filbert, MD 04/29/18 [phone]

## 2018-06-16 ENCOUNTER — Emergency Department (HOSPITAL_COMMUNITY)
Admission: EM | Admit: 2018-06-16 | Discharge: 2018-06-17 | Disposition: A | Discharge status: Home / Self Care | Payer: Self-pay | Attending: Emergency Medicine | Admitting: Emergency Medicine

## 2018-06-16 ENCOUNTER — Encounter (HOSPITAL_COMMUNITY): Payer: Self-pay | Admitting: Emergency Medicine

## 2018-06-16 ENCOUNTER — Other Ambulatory Visit: Payer: Self-pay

## 2018-06-16 DIAGNOSIS — H1033 Unspecified acute conjunctivitis, bilateral: Secondary | ICD-10-CM | POA: Insufficient documentation

## 2018-06-16 DIAGNOSIS — Z79899 Other long term (current) drug therapy: Secondary | ICD-10-CM | POA: Insufficient documentation

## 2018-06-16 DIAGNOSIS — F17210 Nicotine dependence, cigarettes, uncomplicated: Secondary | ICD-10-CM | POA: Insufficient documentation

## 2018-06-16 NOTE — ED Triage Notes (Signed)
C/o itchy red eyes with drainage since yesterday.

## 2018-06-17 MED ORDER — ERYTHROMYCIN 5 MG/GM OP OINT
1.0000 "application " | TOPICAL_OINTMENT | Freq: Once | OPHTHALMIC | Status: AC
  Administered 2018-06-17: 1 via OPHTHALMIC
  Filled 2018-06-17: qty 3.5

## 2018-06-17 NOTE — ED Provider Notes (Signed)
[facility] EMERGENCY DEPARTMENT Provider Note   CSN: [id number] Arrival date & time: 06/16/18  1952     History   Chief Complaint Chief Complaint  Patient presents with  . Eye Pain    HPI Ricardo AndersonMichael Caldwell is a 33 y.o. male presents to the emergency department with a chief complaint of bilateral eye redness, itching, watering since yesterday.  He reports that he has awoken with his bilateral eyes matted shut this morning.  He reports that his daughter previously attended daycare, but has been out for the last month due to illnesses.  No known sick contacts at home or at work.  He denies any fever, chills, nasal congestion, otalgia, sore throat, rash, facial swelling, diplopia, blurred vision.  No treatment prior to arrival.  The history is provided by the patient. No language interpreter was used.    History reviewed. No pertinent past medical history.  There are no active problems to display for this patient.   History reviewed. No pertinent surgical history.      Home Medications    Prior to Admission medications   Medication Sig Start Date End Date Taking? Authorizing Provider  hydrochlorothiazide (HYDRODIURIL) 12.5 MG tablet Take 1 tablet (12.5 mg total) by mouth daily. 02/13/18   Zadie RhineWickline, Donald, MD  LORazepam (ATIVAN) 1 MG tablet Take 1 tablet (1 mg total) by mouth 2 (two) times daily as needed for anxiety. 04/29/18 04/29/19  Emily FilbertWilliams, Jonathan E, MD    Family History No family history on file.  Social History Social History   Tobacco Use  . Smoking status: Current Every Day Smoker    Packs/day: 0.50    Types: Cigars  . Smokeless tobacco: Never Used  Substance Use Topics  . Alcohol use: No  . Drug use: Not Currently     Allergies   Patient has no known allergies.   Review of Systems Review of Systems  Constitutional: Negative for activity change, chills and fever.  HENT: Negative for congestion, ear discharge, facial swelling,  hearing loss, sinus pressure, sinus pain, trouble swallowing and voice change.   Eyes: Positive for pain, discharge, redness and itching. Negative for photophobia and visual disturbance.  Respiratory: Negative for shortness of breath.   Cardiovascular: Negative for chest pain.  Gastrointestinal: Negative for abdominal pain, diarrhea, nausea and vomiting.  Musculoskeletal: Negative for back pain.  Skin: Negative for rash.     Physical Exam Updated Vital Signs BP (!) 160/104 (BP Location: Right Arm)   Pulse 68   Temp 97.9 F (36.6 C) (Oral)   Resp 18   SpO2 100%   Physical Exam Vitals signs and nursing note reviewed.  Constitutional:      Appearance: He is well-developed.  HENT:     Head: Normocephalic.     Right Ear: Tympanic membrane, ear canal and external ear normal.     Left Ear: Tympanic membrane, ear canal and external ear normal.     Nose:     Right Sinus: No maxillary sinus tenderness or frontal sinus tenderness.     Left Sinus: No maxillary sinus tenderness or frontal sinus tenderness.     Mouth/Throat:     Mouth: Mucous membranes are moist.  Eyes:     General: Lids are normal. No allergic shiner or scleral icterus.       Right eye: No foreign body.        Left eye: No foreign body.     Extraocular Movements: Extraocular movements intact.  Conjunctiva/sclera:     Right eye: Right conjunctiva is injected. No chemosis, exudate or hemorrhage.    Left eye: Left conjunctiva is injected. No chemosis, exudate or hemorrhage. Neck:     Musculoskeletal: Neck supple.  Cardiovascular:     Rate and Rhythm: Normal rate and regular rhythm.     Heart sounds: No murmur.  Pulmonary:     Effort: Pulmonary effort is normal.  Abdominal:     General: There is no distension.     Palpations: Abdomen is soft.  Skin:    General: Skin is warm and dry.  Neurological:     Mental Status: He is alert.  Psychiatric:        Behavior: Behavior normal.      ED Treatments / Results   Labs (all labs ordered are listed, but only abnormal results are displayed) Labs Reviewed - No data to display  EKG None  Radiology No results found.  Procedures Procedures (including critical care time)  Medications Ordered in ED Medications  erythromycin ophthalmic ointment 1 application (1 application Both Eyes Given 06/17/18 0110)     Initial Impression / Assessment and Plan / ED Course  I have reviewed the triage vital signs and the nursing notes.  Pertinent labs & imaging results that were available during my care of the patient were reviewed by me and considered in my medical decision making (see chart for details).     33 year old male presenting with bilateral eye itching, redness, tearing, and eyes matted shut over the last couple of days.  I do not see any obvious purulent discharge on my exam.  He is having no complaints of change in his vision.  Extraocular movements are intact.  Pupils are equal round and reactive.  He is not in any apparent distress.  Considered bacterial versus viral conjunctivitis.  Since the patient is complaining of his eyes being matted shut, will cover for bacterial conjunctivitis with erythromycin ointment.  He has also been given strict instructions on frequent handwashing to avoid spread of infection.  Low suspicion for retrobulbar hematoma, uveitis, or keratitis.  Strict return precautions given.  Patient is hemodynamically stable and in no acute distress.  He is safe for discharge home with outpatient follow-up at this time.   Final Clinical Impressions(s) / ED Diagnoses   Final diagnoses:  Acute conjunctivitis of both eyes, unspecified acute conjunctivitis type    ED Discharge Orders    None       Barkley Boards, PA-C 06/17/18 3614    Zadie Rhine, MD 06/18/18 [phone]

## 2018-06-17 NOTE — Discharge Instructions (Addendum)
Thank you for allowing me to care for you today in the Emergency Department.    Apply 0.5 inches of erythromycin ointment to the lower eyelashes every 6 hours while you are awake for the next 5 to 7 days.  You can obtain over-the-counter lubricant drops to help with itching until your symptoms improve.  Use as directed on the label.  These are available at store such as [facility] or [facility].  Conjunctivitis is extremely contagious.  Make sure that you are washing your hands with warm water and soap frequently until your symptoms resolve.  Return to the emergency department if you develop a high fever, significant pain with movement of your eyes, if you develop double vision of your eyes, loss of vision in one or both eyes, or other new, concerning symptoms.

## 2018-07-21 ENCOUNTER — Other Ambulatory Visit: Payer: Self-pay

## 2018-07-21 ENCOUNTER — Encounter: Payer: Self-pay | Admitting: Emergency Medicine

## 2018-07-21 ENCOUNTER — Emergency Department
Admission: EM | Admit: 2018-07-21 | Discharge: 2018-07-21 | Disposition: A | Discharge status: Home / Self Care | Payer: Self-pay | Attending: Emergency Medicine | Admitting: Emergency Medicine

## 2018-07-21 DIAGNOSIS — I10 Essential (primary) hypertension: Secondary | ICD-10-CM | POA: Insufficient documentation

## 2018-07-21 DIAGNOSIS — Z79899 Other long term (current) drug therapy: Secondary | ICD-10-CM | POA: Insufficient documentation

## 2018-07-21 DIAGNOSIS — F17290 Nicotine dependence, other tobacco product, uncomplicated: Secondary | ICD-10-CM | POA: Insufficient documentation

## 2018-07-21 DIAGNOSIS — H1013 Acute atopic conjunctivitis, bilateral: Secondary | ICD-10-CM | POA: Insufficient documentation

## 2018-07-21 HISTORY — DX: Essential (primary) hypertension: I10

## 2018-07-21 MED ORDER — OLOPATADINE HCL 0.2 % OP SOLN: OPHTHALMIC | 0 refills | Status: DC

## 2018-07-21 NOTE — ED Provider Notes (Signed)
[facility] Emergency Department Provider Note  ____________________________________________   First MD Initiated Contact with Patient 07/21/18 1423     (approximate)  I have reviewed the triage vital signs and the nursing notes.   HISTORY  Chief Complaint Conjunctivitis and Eye Drainage   HPI Ricardo Caldwell is a 33 y.o. male presents to the ED with complaint of eye drainage for 3 to 4 weeks.  She states that he was seen at [facility] and was prescribed an ointment that he states only helped a little bit.  He denies any visual changes.  There has not been any yellow drainage or crusting.  Patient does not wear contacts.  There is been no injury to his eyes.  He denies any eye pain.     Past Medical History:  Diagnosis Date  . Hypertension     There are no active problems to display for this patient.   History reviewed. No pertinent surgical history.  Prior to Admission medications   Medication Sig Start Date End Date Taking? Authorizing Provider  hydrochlorothiazide (HYDRODIURIL) 12.5 MG tablet Take 1 tablet (12.5 mg total) by mouth daily. 02/13/18   Zadie Rhine, MD  LORazepam (ATIVAN) 1 MG tablet Take 1 tablet (1 mg total) by mouth 2 (two) times daily as needed for anxiety. 04/29/18 04/29/19  Emily Filbert, MD  Olopatadine HCl 0.2 % SOLN 1 drop each eye once a day 07/21/18   Tommi Rumps, PA-C    Allergies Patient has no known allergies.  No family history on file.  Social History Social History   Tobacco Use  . Smoking status: Current Every Day Smoker    Packs/day: 0.50    Types: Cigars  . Smokeless tobacco: Never Used  Substance Use Topics  . Alcohol use: No  . Drug use: Not Currently    Review of Systems Constitutional: No fever/chills Eyes: Positive eye irritation.  No vision changes. ENT: No sore throat. Cardiovascular: Denies chest pain. Respiratory: Denies shortness of breath. Skin: Negative for  rash. Neurological: Negative for headaches ___________________________________________   PHYSICAL EXAM:  VITAL SIGNS: ED Triage Vitals [07/21/18 1218]  Enc Vitals Group     BP (!) 155/86     Pulse Rate 71     Resp 20     Temp 98.4 F (36.9 C)     Temp Source Oral     SpO2 100 %     Weight 230 lb (104.3 kg)     Height 6' (1.829 m)     Head Circumference      Peak Flow      Pain Score 5     Pain Loc      Pain Edu?      Excl. in GC?    Constitutional: Alert and oriented. Well appearing and in no acute distress. Eyes: Conjunctivae are minimally injected.  Watery in appearance.  No exudate.  PERRL. EOMI. Head: Atraumatic. Nose: No congestion/rhinnorhea. Mouth/Throat: Mucous membranes are moist.  Oropharynx non-erythematous. Neck: No stridor.   Cardiovascular: Normal rate, regular rhythm. Grossly normal heart sounds.  Good peripheral circulation. Respiratory: Normal respiratory effort.  No retractions. Lungs CTAB. Neurologic:  Normal speech and language. No gross focal neurologic deficits are appreciated.  Skin:  Skin is warm, dry and intact. Psychiatric: Mood and affect are normal. Speech and behavior are normal.  ____________________________________________   LABS (all labs ordered are listed, but only abnormal results are displayed)  Labs Reviewed - No data to display  PROCEDURES  Procedure(s) performed (including Critical Care):  Procedures   ____________________________________________   INITIAL IMPRESSION / ASSESSMENT AND PLAN / ED COURSE  As part of my medical decision making, I reviewed the following data within the electronic MEDICAL RECORD NUMBER Notes from prior ED visits and [place] Controlled Substance Database  Patient presents to the ED with complaint of eye irritation and drainage for the last 4 weeks.  Patient states that he was seen at [facility] and prescribed an ointment which looking in his chart appears to have been erythromycin.  On exam today there is no  yellow thick drainage but more of a clear watery appearance.  Exam is more consistent with a allergic conjunctivitis.  Patient was given a prescription for Pataday 1 drop to each eye daily.  He is to follow-up with Dr. Sharman Crate who is the ophthalmologist on call if he continues to have any problems.  ____________________________________________   FINAL CLINICAL IMPRESSION(S) / ED DIAGNOSES  Final diagnoses:  Allergic conjunctivitis of both eyes     ED Discharge Orders         Ordered    Olopatadine HCl 0.2 % SOLN     07/21/18 1435           Note:  This document was prepared using Dragon voice recognition software and may include unintentional dictation errors.    Tommi Rumps, PA-C 07/21/18 1446    Jene Every, MD 07/21/18 1452

## 2018-07-21 NOTE — Discharge Instructions (Addendum)
Begin using eyedrops 1 drop each eye once a day.  You also need follow-up with an ophthalmologist.  The ophthalmologist on-call in [place] today is Dr. Sharman Crate and his contact information is listed on your discharge papers.  A good Rx card also is included with your discharge papers to decrease the cost of this medication for you.

## 2018-07-21 NOTE — ED Notes (Signed)
See triage note  Presents with eye irritation and draining  No fever   Sx's started about 3 weeks ago

## 2018-07-21 NOTE — ED Triage Notes (Signed)
Pt reports eye redness, itching and draining for the past 3-4 weeks.

## 2018-09-24 ENCOUNTER — Emergency Department
Admission: EM | Admit: 2018-09-24 | Discharge: 2018-09-24 | Disposition: A | Discharge status: Home / Self Care | Payer: Self-pay | Attending: Emergency Medicine | Admitting: Emergency Medicine

## 2018-09-24 ENCOUNTER — Emergency Department: Payer: Self-pay

## 2018-09-24 ENCOUNTER — Encounter: Payer: Self-pay | Admitting: Emergency Medicine

## 2018-09-24 ENCOUNTER — Other Ambulatory Visit: Payer: Self-pay

## 2018-09-24 DIAGNOSIS — N451 Epididymitis: Secondary | ICD-10-CM

## 2018-09-24 DIAGNOSIS — N433 Hydrocele, unspecified: Secondary | ICD-10-CM

## 2018-09-24 DIAGNOSIS — I10 Essential (primary) hypertension: Secondary | ICD-10-CM | POA: Insufficient documentation

## 2018-09-24 DIAGNOSIS — F17290 Nicotine dependence, other tobacco product, uncomplicated: Secondary | ICD-10-CM | POA: Insufficient documentation

## 2018-09-24 DIAGNOSIS — Z79899 Other long term (current) drug therapy: Secondary | ICD-10-CM | POA: Insufficient documentation

## 2018-09-24 LAB — URINALYSIS, COMPLETE (UACMP) WITH MICROSCOPIC
Bacteria, UA: NONE SEEN
Bilirubin Urine: NEGATIVE
Glucose, UA: NEGATIVE mg/dL
Ketones, ur: NEGATIVE mg/dL
Nitrite: NEGATIVE
Protein, ur: 100 mg/dL — AB
Specific Gravity, Urine: 1.025 (ref 1.005–1.030)
WBC, UA: >50 WBC/hpf — ABNORMAL HIGH (ref 0–5)
pH: 6 (ref 5.0–8.0)

## 2018-09-24 LAB — CHLAMYDIA/NGC RT PCR (ARMC ONLY)
Chlamydia Tr: DETECTED — AB
N gonorrhoeae: NOT DETECTED

## 2018-09-24 MED ORDER — CEFTRIAXONE SODIUM 1 G IJ SOLR
500.0000 mg | Freq: Once | INTRAMUSCULAR | Status: AC
  Administered 2018-09-24: 500 mg via INTRAMUSCULAR
  Filled 2018-09-24: qty 10

## 2018-09-24 MED ORDER — DOXYCYCLINE HYCLATE 100 MG PO CAPS: 100.0000 mg | ORAL_CAPSULE | Freq: Two times a day (BID) | ORAL | 0 refills | Status: DC

## 2018-09-24 MED ORDER — SULFAMETHOXAZOLE-TRIMETHOPRIM 800-160 MG PO TABS: 1.0000 | ORAL_TABLET | Freq: Two times a day (BID) | ORAL | 0 refills | Status: DC

## 2018-09-24 NOTE — ED Triage Notes (Signed)
Patient presents to the ED with pain and swelling to his right testicle since Saturday.  Patient reports tenderness yesterday and increased swelling today.  Patient reports some urinary issues with frequency and halting urination.

## 2018-09-24 NOTE — ED Provider Notes (Signed)
[facility] Emergency Department Provider Note  ____________________________________________  Time seen: Approximately 1:58 PM  I have reviewed the triage vital signs and the nursing notes.   HISTORY  Chief Complaint Testicle Pain    HPI Ricardo Caldwell is a 33 y.o. male with a past medical history of hypertension who complains of gradual onset of right testicular pain and swelling for the past week.  Denies trauma.  Denies dysuria frequency urgency or penile discharge.  Doubts the possibility of STI.  Pain is nonradiating, moderate intensity.  No fevers chills body aches.  No vomiting or diarrhea.  No rectal pain or painful bowel movements.  In triage he reported some urinary frequency.      Past Medical History:  Diagnosis Date  . Hypertension      There are no active problems to display for this patient.    History reviewed. No pertinent surgical history.   Prior to Admission medications   Medication Sig Start Date End Date Taking? Authorizing Provider  doxycycline (VIBRAMYCIN) 100 MG capsule Take 1 capsule (100 mg total) by mouth 2 (two) times daily. 09/24/18   Sharman CheekStafford, Jannifer Fischler, MD  hydrochlorothiazide (HYDRODIURIL) 12.5 MG tablet Take 1 tablet (12.5 mg total) by mouth daily. 02/13/18   Zadie RhineWickline, Donald, MD  LORazepam (ATIVAN) 1 MG tablet Take 1 tablet (1 mg total) by mouth 2 (two) times daily as needed for anxiety. 04/29/18 04/29/19  Emily FilbertWilliams, Jonathan E, MD  Olopatadine HCl 0.2 % SOLN 1 drop each eye once a day 07/21/18   Bridget HartshornSummers, Rhonda L, PA-C  sulfamethoxazole-trimethoprim (BACTRIM DS) 800-160 MG tablet Take 1 tablet by mouth 2 (two) times daily. 09/24/18   Sharman CheekStafford, Emileigh Kellett, MD     Allergies Patient has no known allergies.   No family history on file.  Social History Social History   Tobacco Use  . Smoking status: Current Every Day Smoker    Packs/day: 0.50    Types: Cigars  . Smokeless tobacco: Never Used  Substance Use Topics   . Alcohol use: No  . Drug use: Not Currently    Review of Systems  Constitutional:   No fever or chills.  ENT:   No sore throat. No rhinorrhea. Cardiovascular:   No chest pain or syncope. Respiratory:   No dyspnea or cough. Gastrointestinal:   Negative for abdominal pain, vomiting and diarrhea.  Musculoskeletal:   Negative for focal pain or swelling All other systems reviewed and are negative except as documented above in ROS and HPI.  ____________________________________________   PHYSICAL EXAM:  VITAL SIGNS: ED Triage Vitals  Enc Vitals Group     BP 09/24/18 1137 (!) 144/85     Pulse Rate 09/24/18 1137 61     Resp 09/24/18 1137 16     Temp 09/24/18 1137 98.4 F (36.9 C)     Temp Source 09/24/18 1137 Oral     SpO2 09/24/18 1137 100 %     Weight --      Height --      Head Circumference --      Peak Flow --      Pain Score 09/24/18 1306 5     Pain Loc --      Pain Edu? --      Excl. in GC? --     Vital signs reviewed, nursing assessments reviewed.   Constitutional:   Alert and oriented. Non-toxic appearance. Eyes:   Conjunctivae are normal. EOMI.  ENT      Head:  Normocephalic and atraumatic.       Neck:   No meningismus. Full ROM. Hematological/Lymphatic/Immunilogical:   No inguinal lymphadenopathy. Cardiovascular:   RRR.  Respiratory:   Normal respiratory effort without tachypnea/retractions. Gastrointestinal:   Soft and nontender. Non distended. There is no CVA tenderness.  No rebound, rigidity, or guarding. Genitourinary:   Enlarged right scrotum with epididymal tenderness.  No abscess.  No horizontal lie.  No lesions.  No discharge Musculoskeletal:   Normal range of motion in all extremities. No joint effusions.  No lower extremity tenderness.  No edema. Neurologic:   Normal speech and language.  Motor grossly intact. No acute focal neurologic deficits are appreciated.  Skin:    Skin is warm, dry and intact. No rash noted.  No petechiae, purpura, or  bullae.  ____________________________________________    LABS (pertinent positives/negatives) (all labs ordered are listed, but only abnormal results are displayed) Labs Reviewed  URINALYSIS, COMPLETE (UACMP) WITH MICROSCOPIC - Abnormal; Notable for the following components:      Result Value   Color, Urine YELLOW (*)    APPearance HAZY (*)    Hgb urine dipstick SMALL (*)    Protein, ur 100 (*)    Leukocytes,Ua MODERATE (*)    WBC, UA >50 (*)    All other components within normal limits  CHLAMYDIA/NGC RT PCR [facility] ONLY)   ____________________________________________   EKG    ____________________________________________    RADIOLOGY  US Scrotum  Result Date: 09/24/2018 CLINICAL DATA:  Acute right testicular pain and swelling. EXAM: SCROTAL ULTRASOUND DOPPLER ULTRASOUND OF THE TESTICLES TECHNIQUE: Complete ultrasound examination of the testicles, epididymis, and other scrotal structures was performed. Color and spectral Doppler ultrasound were also utilized to evaluate blood flow to the testicles. COMPARISON:  None. FINDINGS: Right testicle Measurements: 4.2 x 4.0 x 2.9 cm. No mass or microlithiasis visualized. Left testicle Measurements: 4.0 x 3.6 x 2.4 cm. No mass or microlithiasis visualized. Right epididymis: Enlarged and hypervascular suggesting epididymitis. Left epididymis:  Normal in size and appearance. Hydrocele:  Moderate size right hydrocele is noted. Varicocele:  None visualized. Pulsed Doppler interrogation of both testes demonstrates normal low resistance arterial and venous waveforms bilaterally. IMPRESSION: Findings consistent with right-sided epididymitis. Moderate right-sided hydrocele is noted. Electronically Signed   By: Lupita Raider M.D.   On: 09/24/2018 12:48    ____________________________________________   PROCEDURES Procedures  ____________________________________________    CLINICAL IMPRESSION / ASSESSMENT AND PLAN / ED  COURSE  Medications ordered in the ED: Medications  cefTRIAXone (ROCEPHIN) injection 500 mg (500 mg Intramuscular Given 09/24/18 1353)    Pertinent labs & imaging results that were available during my care of the patient were reviewed by me and considered in my medical decision making (see chart for details).  Ricardo Caldwell was evaluated in Emergency Department on 09/24/2018 for the symptoms described in the history of present illness. He was evaluated in the context of the global COVID-19 pandemic, which necessitated consideration that the patient might be at risk for infection with the SARS-CoV-2 virus that causes COVID-19. Institutional protocols and algorithms that pertain to the evaluation of patients at risk for COVID-19 are in a state of rapid change based on information released by regulatory bodies including the CDC and federal and state organizations. These policies and algorithms were followed during the patient's care in the ED.   Patient presents with scrotal swelling and pain.  Ultrasound reveals a hydrocele and evidence of epididymitis.  UA shows white blood cells, no bacteria.  Due to risk of STI, patient given IM ceftriaxone.  I will give him a prescription for doxycycline and Bactrim for the next week to treat possible chlamydia versus coliform infection.  Follow-up with urology.  No evidence of torsion or abscess.  Patient not able to wait for the Chlamydia gonorrhea test result due to needing to pick up his son so he is being treated empirically.  Clinical Course as of Sep 23 1356  Fri Sep 24, 2018  1346 Chlamydia/NGC rt PCR [facility] only) [JM]    Clinical Course User Index [JM] Karmen Stabs, Charlesetta Ivory, PA-C     ____________________________________________   FINAL CLINICAL IMPRESSION(S) / ED DIAGNOSES    Final diagnoses:  Epididymitis, right  Hydrocele, right     ED Discharge Orders         Ordered    doxycycline (VIBRAMYCIN) 100 MG capsule  2 times daily      09/24/18 1352    sulfamethoxazole-trimethoprim (BACTRIM DS) 800-160 MG tablet  2 times daily     09/24/18 1352          Portions of this note were generated with dragon dictation software. Dictation errors may occur despite best attempts at proofreading.   Sharman Cheek, MD 09/24/18 1401

## 2018-09-27 ENCOUNTER — Telehealth: Payer: Self-pay | Admitting: Emergency Medicine

## 2018-09-27 NOTE — Telephone Encounter (Addendum)
Called patient to give result of std test--positive for chlamydia.  Left message.  09/28/18  0830--called patient again and spoke to him.  He says he has not filled either prescription due to cost, and says he feels fine now.  I had talked about need for partner treatment and free treatment at [facility].  I told him he could go to [facility] and get free treatment as well.  I told him that the doxycycline must be taken to treat the std he has.  He agrees to go to [facility].

## 2019-04-29 ENCOUNTER — Other Ambulatory Visit: Payer: Self-pay

## 2019-04-29 DIAGNOSIS — Z20822 Contact with and (suspected) exposure to covid-19: Secondary | ICD-10-CM

## 2019-05-01 LAB — NOVEL CORONAVIRUS, NAA: SARS-CoV-2, NAA: NOT DETECTED

## 2020-04-02 ENCOUNTER — Other Ambulatory Visit: Payer: Self-pay

## 2020-04-02 ENCOUNTER — Encounter (HOSPITAL_COMMUNITY): Payer: Self-pay

## 2020-04-02 ENCOUNTER — Ambulatory Visit (HOSPITAL_COMMUNITY)
Admission: EM | Admit: 2020-04-02 | Discharge: 2020-04-02 | Disposition: A | Discharge status: Home / Self Care | Payer: Self-pay | Attending: Family Medicine | Admitting: Family Medicine

## 2020-04-02 DIAGNOSIS — Z20822 Contact with and (suspected) exposure to covid-19: Secondary | ICD-10-CM | POA: Insufficient documentation

## 2020-04-02 DIAGNOSIS — R059 Cough, unspecified: Secondary | ICD-10-CM

## 2020-04-02 DIAGNOSIS — I10 Essential (primary) hypertension: Secondary | ICD-10-CM

## 2020-04-02 DIAGNOSIS — F17210 Nicotine dependence, cigarettes, uncomplicated: Secondary | ICD-10-CM | POA: Insufficient documentation

## 2020-04-02 DIAGNOSIS — R0602 Shortness of breath: Secondary | ICD-10-CM | POA: Insufficient documentation

## 2020-04-02 DIAGNOSIS — Z7901 Long term (current) use of anticoagulants: Secondary | ICD-10-CM | POA: Insufficient documentation

## 2020-04-02 DIAGNOSIS — R062 Wheezing: Secondary | ICD-10-CM

## 2020-04-02 DIAGNOSIS — R509 Fever, unspecified: Secondary | ICD-10-CM | POA: Insufficient documentation

## 2020-04-02 LAB — RESP PANEL BY RT PCR (RSV, FLU A&B, COVID)
Influenza A by PCR: NEGATIVE
Influenza B by PCR: NEGATIVE
Respiratory Syncytial Virus by PCR: NEGATIVE
SARS Coronavirus 2 by RT PCR: NEGATIVE

## 2020-04-02 MED ORDER — PREDNISONE 20 MG PO TABS
20.0000 mg | ORAL_TABLET | Freq: Once | ORAL | Status: AC
  Administered 2020-04-02: 20 mg via ORAL

## 2020-04-02 MED ORDER — ALBUTEROL SULFATE HFA 108 (90 BASE) MCG/ACT IN AERS
INHALATION_SPRAY | RESPIRATORY_TRACT | Status: AC
  Filled 2020-04-02: qty 6.7

## 2020-04-02 MED ORDER — AMLODIPINE BESYLATE 10 MG PO TABS: 10.0000 mg | ORAL_TABLET | Freq: Every day | ORAL | 1 refills | Status: DC

## 2020-04-02 MED ORDER — PREDNISONE 20 MG PO TABS
ORAL_TABLET | ORAL | Status: AC
  Filled 2020-04-02: qty 1

## 2020-04-02 MED ORDER — PREDNISONE 20 MG PO TABS: 20.0000 mg | ORAL_TABLET | Freq: Two times a day (BID) | ORAL | 0 refills | Status: DC

## 2020-04-02 MED ORDER — ALBUTEROL SULFATE HFA 108 (90 BASE) MCG/ACT IN AERS
2.0000 | INHALATION_SPRAY | Freq: Once | RESPIRATORY_TRACT | Status: AC
  Administered 2020-04-02: 2 via RESPIRATORY_TRACT

## 2020-04-02 NOTE — ED Provider Notes (Signed)
[facility]    CSN: [id number] Arrival date & time: 04/02/20  1615      History   Chief Complaint No chief complaint on file.   HPI Ricardo Caldwell is a 34 y.o. male.   HPI   Patient is here for cough for about a week.  He states at first he had hiccups for the first few days.  He has had some fatigue.  Fever.  He states that he has felt short of breath.  He tried a Covid test at home.  It was negative.  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  Unknown exposures.  He has "not yet" completed his Covid vaccinations.[name]  He is a smoker.  No underlying history of asthma or lung disease. No change in taste and smell No nausea or vomiting I noticed in the medical record that he has had elevated blood pressure consistently since 2019.  He states he was prescribed hydrochlorothiazide in the past.  He states his this was not strong enough.  He states he would like something else for his blood pressure.  I emphasized to him the importance of follow-up.  I recommend the [place] [facility].  Past Medical History:  Diagnosis Date   Hypertension     There are no problems to display for this patient.   History reviewed. No pertinent surgical history.     Home Medications    Prior to Admission medications   Medication Sig Start Date End Date Taking? Authorizing Provider  amLODipine (NORVASC) 10 MG tablet Take 1 tablet (10 mg total) by mouth daily. 04/02/20   Eustace Moore, MD  predniSONE (DELTASONE) 20 MG tablet Take 1 tablet (20 mg total) by mouth 2 (two) times daily with a meal. 04/02/20   Eustace Moore, MD  hydrochlorothiazide (HYDRODIURIL) 12.5 MG tablet Take 1 tablet (12.5 mg total) by mouth daily. 02/13/18 04/02/20  Zadie Rhine, MD    Family History History reviewed. No pertinent family history.  Social History Social History   Tobacco Use   Smoking status: Current Every Day Smoker    Packs/day: 0.50    Types: Cigars    Smokeless tobacco: Never Used  Substance Use Topics   Alcohol use: No   Drug use: Not Currently     Allergies   Patient has no known allergies.   Review of Systems Review of Systems See HPI  Physical Exam Triage Vital Signs ED Triage Vitals  Enc Vitals Group     BP 04/02/20 1858 (!) 174/113     Pulse Rate 04/02/20 1858 73     Resp 04/02/20 1858 16     Temp 04/02/20 1858 98.7 F (37.1 C)     Temp Source 04/02/20 1858 Oral     SpO2 04/02/20 1858 100 %     Weight --      Height --      Head Circumference --      Peak Flow --      Pain Score 04/02/20 1903 7     Pain Loc --      Pain Edu? --      Excl. in GC? --    No data found.  Updated Vital Signs BP (!) 174/113 (BP Location: Right Arm)    Pulse 73    Temp 98.7 F (37.1 C) (Oral)    Resp 16    SpO2 100%      Physical Exam Constitutional:  General: He is not in acute distress.    Appearance: He is well-developed. He is not ill-appearing.  HENT:     Head: Normocephalic and atraumatic.     Mouth/Throat:     Comments: Mask is in place.  Oropharynx is benign.  Nasal passages clear Eyes:     Conjunctiva/sclera: Conjunctivae normal.     Pupils: Pupils are equal, round, and reactive to light.  Cardiovascular:     Rate and Rhythm: Normal rate and regular rhythm.     Heart sounds: Normal heart sounds.  Pulmonary:     Effort: Pulmonary effort is normal. No respiratory distress.     Breath sounds: Wheezing and rhonchi present.     Comments: Patient has inspiratory wheezes and rhonchi throughout.  After albuterol treatment, the wheezes have cleared.  Few rhonchi anteriorly.  No rales.  Patient refuses chest x-ray Abdominal:     Palpations: Abdomen is soft.  Musculoskeletal:        General: Normal range of motion.     Cervical back: Normal range of motion.     Right lower leg: No edema.     Left lower leg: No edema.  Skin:    General: Skin is warm and dry.  Neurological:     Mental Status: He is alert.    Psychiatric:        Mood and Affect: Mood normal.        Behavior: Behavior normal.      UC Treatments / Results  Labs (all labs ordered are listed, but only abnormal results are displayed) Labs Reviewed  RESP PANEL BY RT PCR (RSV, FLU A&B, COVID)    EKG   Radiology No results found.  Procedures Procedures (including critical care time)  Medications Ordered in UC Medications  albuterol (VENTOLIN HFA) 108 (90 Base) MCG/ACT inhaler 2 puff (2 puffs Inhalation Given 04/02/20 1924)  predniSONE (DELTASONE) tablet 20 mg (20 mg Oral Given 04/02/20 1924)    Initial Impression / Assessment and Plan / UC Course  I have reviewed the triage vital signs and the nursing notes.  Pertinent labs & imaging results that were available during my care of the patient were reviewed by me and considered in my medical decision making (see chart for details).     Patient likely has a viral URI.  Testing is done for flu Covid and RSV.  We will place him on prednisone and albuterol along with fluids and Mucinex.  Return here as needed. Patient has hypertension.  Untreated.  I have been placing him on amlodipine 10 mg a day.  With the level of his hypertension I do not believe 5 mg is an appropriate starting dose.  I told him to start it tonight.  Needs to follow-up with internal medicine. Final Clinical Impressions(s) / UC Diagnoses   Final diagnoses:  Cough  Wheezing  Essential hypertension     Discharge Instructions     I have prescribed amlodipine for your blood pressure Take 1 pill a day.  Start tonight You must have a primary care doctor to get refills on this medication.  I have only given you a 1 month supply with 1 refill I have given you the number of the [place] [facility] clinic for follow-up  For the respiratory infection take prednisone 2 times a day for 5 days Use the inhaler as needed for wheezing Drink plenty of fluids Take Mucinex DM for the cough and  congestion Quarantine at home until you  have your test results, check MyChart for them tomorrow   ED Prescriptions    Medication Sig Dispense Auth. Provider   predniSONE (DELTASONE) 20 MG tablet Take 1 tablet (20 mg total) by mouth 2 (two) times daily with a meal. 10 tablet Eustace Moore, MD   amLODipine (NORVASC) 10 MG tablet Take 1 tablet (10 mg total) by mouth daily. 30 tablet Eustace Moore, MD     PDMP not reviewed this encounter.   Eustace Moore, MD 04/02/20 [phone]

## 2020-04-02 NOTE — Discharge Instructions (Signed)
I have prescribed amlodipine for your blood pressure Take 1 pill a day.  Start tonight You must have a primary care doctor to get refills on this medication.  I have only given you a 1 month supply with 1 refill I have given you the number of the [place] [facility] clinic for follow-up  For the respiratory infection take prednisone 2 times a day for 5 days Use the inhaler as needed for wheezing Drink plenty of fluids Take Mucinex DM for the cough and congestion Quarantine at home until you have your test results, check MyChart for them tomorrow

## 2020-04-02 NOTE — ED Triage Notes (Signed)
Pt presents with a cough, headache, chest congestion and persistent hiccups x 1 week, pt states he no longer has hiccups.  Pt states he had a covid test done 2 days ago. It resulted negative.

## 2020-12-05 ENCOUNTER — Emergency Department (HOSPITAL_COMMUNITY): Payer: Self-pay

## 2020-12-05 ENCOUNTER — Other Ambulatory Visit: Payer: Self-pay

## 2020-12-05 ENCOUNTER — Emergency Department (HOSPITAL_COMMUNITY)
Admission: EM | Admit: 2020-12-05 | Discharge: 2020-12-05 | Disposition: A | Discharge status: Home / Self Care | Payer: Self-pay | Attending: Emergency Medicine | Admitting: Emergency Medicine

## 2020-12-05 DIAGNOSIS — I10 Essential (primary) hypertension: Secondary | ICD-10-CM | POA: Insufficient documentation

## 2020-12-05 DIAGNOSIS — F17210 Nicotine dependence, cigarettes, uncomplicated: Secondary | ICD-10-CM | POA: Insufficient documentation

## 2020-12-05 DIAGNOSIS — Z9114 Patient's other noncompliance with medication regimen: Secondary | ICD-10-CM | POA: Insufficient documentation

## 2020-12-05 DIAGNOSIS — M5410 Radiculopathy, site unspecified: Secondary | ICD-10-CM

## 2020-12-05 DIAGNOSIS — M5412 Radiculopathy, cervical region: Secondary | ICD-10-CM | POA: Insufficient documentation

## 2020-12-05 DIAGNOSIS — Z79899 Other long term (current) drug therapy: Secondary | ICD-10-CM | POA: Insufficient documentation

## 2020-12-05 LAB — CBC WITH DIFFERENTIAL/PLATELET
Abs Immature Granulocytes: 0.05 10*3/uL (ref 0.00–0.07)
Basophils Absolute: 0.1 10*3/uL (ref 0.0–0.1)
Basophils Relative: 0 %
Eosinophils Absolute: 0.3 10*3/uL (ref 0.0–0.5)
Eosinophils Relative: 2 %
HCT: 46 % (ref 39.0–52.0)
Hemoglobin: 14.9 g/dL (ref 13.0–17.0)
Immature Granulocytes: 0 %
Lymphocytes Relative: 27 %
Lymphs Abs: 4 10*3/uL (ref 0.7–4.0)
MCH: 27.7 pg (ref 26.0–34.0)
MCHC: 32.4 g/dL (ref 30.0–36.0)
MCV: 85.7 fL (ref 80.0–100.0)
Monocytes Absolute: 1.4 10*3/uL — ABNORMAL HIGH (ref 0.1–1.0)
Monocytes Relative: 10 %
Neutro Abs: 9 10*3/uL — ABNORMAL HIGH (ref 1.7–7.7)
Neutrophils Relative %: 61 %
Platelets: 391 10*3/uL (ref 150–400)
RBC: 5.37 MIL/uL (ref 4.22–5.81)
RDW: 14.3 % (ref 11.5–15.5)
WBC: 14.8 10*3/uL — ABNORMAL HIGH (ref 4.0–10.5)
nRBC: 0 % (ref 0.0–0.2)

## 2020-12-05 LAB — BASIC METABOLIC PANEL
Anion gap: 8 (ref 5–15)
BUN: 12 mg/dL (ref 6–20)
CO2: 25 mmol/L (ref 22–32)
Calcium: 9 mg/dL (ref 8.9–10.3)
Chloride: 105 mmol/L (ref 98–111)
Creatinine, Ser: 1.13 mg/dL (ref 0.61–1.24)
GFR, Estimated: >60 mL/min (ref >60)
Glucose, Bld: 114 mg/dL — ABNORMAL HIGH (ref 70–99)
Potassium: 3.4 mmol/L — ABNORMAL LOW (ref 3.5–5.1)
Sodium: 138 mmol/L (ref 135–145)

## 2020-12-05 LAB — TROPONIN I (HIGH SENSITIVITY): Troponin I (High Sensitivity): 12 ng/L (ref <18)

## 2020-12-05 MED ORDER — PREDNISONE 10 MG PO TABS: ORAL_TABLET | ORAL | 0 refills | Status: DC

## 2020-12-05 MED ORDER — AMLODIPINE BESYLATE 10 MG PO TABS: 10.0000 mg | ORAL_TABLET | Freq: Every day | ORAL | 0 refills | Status: AC

## 2020-12-05 MED ORDER — HYDROCODONE-ACETAMINOPHEN 5-325 MG PO TABS: 1.0000 | ORAL_TABLET | ORAL | 0 refills | Status: DC | PRN

## 2020-12-05 MED ORDER — OXYCODONE-ACETAMINOPHEN 5-325 MG PO TABS
1.0000 | ORAL_TABLET | Freq: Once | ORAL | Status: AC
  Administered 2020-12-05: 1 via ORAL
  Filled 2020-12-05 (×2): qty 1

## 2020-12-05 NOTE — ED Notes (Signed)
Pt very upset over not being discharged in triage. Wants discharge papers so he can leave. Explained that we cant give him papers until he sees the provider in the back and is discharged. Explained we cant make him stay but if he left it would be against medical advice. States he will stay but does not want any more blood work drawn.

## 2020-12-05 NOTE — ED Triage Notes (Signed)
Pain started from the neck radiating down to chest and left arm. Denies any blood thinner use. Hypertensive in triage, but not compliant with med.

## 2020-12-05 NOTE — ED Provider Notes (Addendum)
[facility] EMERGENCY DEPARTMENT Provider Note   CSN: [id number] Arrival date & time: 12/05/20  0134     History Chief Complaint  Patient presents with   Chest Pain    Ricardo Caldwell is a 35 y.o. male.  Patient to ED with pain that starts in his left neck over the last several days without injury. The pain has been radiating to the left arm and also affects the left upper chest and posterior shoulder. He feels the left arm is weak at times. No cough or fever, no nausea, or diaphoresis. He has taken ibuprofen without relief. He is here tonight because the pain in the neck kept him awake.   The history is provided by the patient. No language interpreter was used.  Chest Pain Associated symptoms: no abdominal pain, no cough, no fever, no nausea and no numbness       Past Medical History:  Diagnosis Date   Hypertension     There are no problems to display for this patient.   No past surgical history on file.     No family history on file.  Social History   Tobacco Use   Smoking status: Every Day    Packs/day: 0.50    Types: Cigars, Cigarettes   Smokeless tobacco: Never  Substance Use Topics   Alcohol use: No   Drug use: Not Currently    Home Medications Prior to Admission medications   Medication Sig Start Date End Date Taking? Authorizing Provider  amLODipine (NORVASC) 10 MG tablet Take 1 tablet (10 mg total) by mouth daily. 04/02/20   Eustace Moore, MD  predniSONE (DELTASONE) 20 MG tablet Take 1 tablet (20 mg total) by mouth 2 (two) times daily with a meal. 04/02/20   Eustace Moore, MD  hydrochlorothiazide (HYDRODIURIL) 12.5 MG tablet Take 1 tablet (12.5 mg total) by mouth daily. 02/13/18 04/02/20  Zadie Rhine, MD    Allergies    Patient has no known allergies.  Review of Systems   Review of Systems  Constitutional:  Negative for fever.  Respiratory:  Negative for cough.   Cardiovascular:  Positive for chest pain (See  HPI.).  Gastrointestinal:  Negative for abdominal pain and nausea.  Musculoskeletal:        See HPI.  Skin:  Negative for color change.  Neurological:  Negative for numbness.   Physical Exam Updated Vital Signs BP (!) 169/107 (BP Location: Right Arm)   Pulse 73   Temp 98.1 F (36.7 C) (Oral)   Resp 18   Ht 6' (1.829 m)   Wt 104 kg   SpO2 98%   BMI 31.10 kg/m   Physical Exam Vitals and nursing note reviewed.  Constitutional:      Appearance: He is well-developed.  HENT:     Head: Normocephalic.  Cardiovascular:     Rate and Rhythm: Normal rate and regular rhythm.     Heart sounds: No murmur heard. Pulmonary:     Effort: Pulmonary effort is normal.     Breath sounds: Normal breath sounds. No wheezing, rhonchi or rales.  Abdominal:     General: Bowel sounds are normal.     Palpations: Abdomen is soft.     Tenderness: There is no abdominal tenderness. There is no guarding or rebound.  Musculoskeletal:        General: Normal range of motion.     Cervical back: Normal range of motion and neck supple.     Comments: Left paracervical  tenderness to palpation that reproduces pain in the left arm. No swelling. FROM UE's without strength deficits. No chest wall tenderness. Distal UE pulses intact.   Skin:    General: Skin is warm and dry.  Neurological:     General: No focal deficit present.     Mental Status: He is alert and oriented to person, place, and time.     Sensory: No sensory deficit.    ED Results / Procedures / Treatments   Labs (all labs ordered are listed, but only abnormal results are displayed) Labs Reviewed - No data to display  EKG None  Radiology No results found.  Procedures Procedures   Medications Ordered in ED Medications - No data to display  ED Course  I have reviewed the triage vital signs and the nursing notes.  Pertinent labs & imaging results that were available during my care of the patient were reviewed by me and considered in my  medical decision making (see chart for details).    MDM Rules/Calculators/A&P                           Patient to ED with neck pain, radiating to left arm, upper chest and posterior shoulder. No injury. Progressive over 2 days. No SOB, cough, fever, nausea. History of HTN, untreated.   Symptoms are reproducible with palpation of the posterior neck. Do not feel symptoms related to ACS, favor radiculopathy. Trop negative after 2 days of symptoms, CXR clear. He is hypertensive, off medications. Will restart Norvasc 10 mg. Will provide steroid taper for radicular pain, and referral for primary care.   Final Clinical Impression(s) / ED Diagnoses Final diagnoses:  None   Cervical radiculopathy Hypertension Medication noncompliance  Rx / DC Orders ED Discharge Orders     None        Elpidio Anis, PA-C 12/05/20 0202    Elpidio Anis, PA-C 12/05/20 0409    Sabas Sous, MD 12/05/20 [phone]

## 2020-12-05 NOTE — Discharge Instructions (Addendum)
Recommend cool compresses to the neck. Take medications as prescribed for neck pain and for high blood pressure.   Call a primary care provider of your choice to schedule an appointment to be seen for further management and treatment.

## 2020-12-10 ENCOUNTER — Ambulatory Visit (HOSPITAL_COMMUNITY)
Admission: EM | Admit: 2020-12-10 | Discharge: 2020-12-10 | Disposition: A | Discharge status: Home / Self Care | Payer: Self-pay | Attending: Sports Medicine | Admitting: Sports Medicine

## 2020-12-10 ENCOUNTER — Encounter (HOSPITAL_COMMUNITY): Payer: Self-pay | Admitting: *Deleted

## 2020-12-10 ENCOUNTER — Other Ambulatory Visit: Payer: Self-pay

## 2020-12-10 DIAGNOSIS — M5412 Radiculopathy, cervical region: Secondary | ICD-10-CM

## 2020-12-10 DIAGNOSIS — I10 Essential (primary) hypertension: Secondary | ICD-10-CM

## 2020-12-10 DIAGNOSIS — S161XXD Strain of muscle, fascia and tendon at neck level, subsequent encounter: Secondary | ICD-10-CM

## 2020-12-10 MED ORDER — MELOXICAM 15 MG PO TABS: 15.0000 mg | ORAL_TABLET | Freq: Every day | ORAL | 0 refills | Status: AC

## 2020-12-10 NOTE — ED Provider Notes (Signed)
[facility]    CSN: [id number] Arrival date & time: 12/10/20  1820      History   Chief Complaint Chief Complaint  Patient presents with   Back Pain    HPI Ricardo Caldwell is a 35 y.o. male for f/u of neck pain.   Patient was seen in the ED on 12/05/2020, after an injury when an open door hit him in the side of the neck/chest.  Patient reports having an x-ray of the chest and neck, which did not show any bony abnormalities at that time.  He was experiencing some radicular symptoms for the left shoulder, so was provided a prescription for a steroid taper and pain medication and referral to primary care.  Today, patient states his pain is improving, but is still lingering on.  The pain is on the left side of his neck and will sometimes shoot into his left shoulder.  He denies any numbness or tingling or radiation down the extremity into the digits.  He denies any spinal tenderness, but reports tenderness and tightness to the left lateral and posterior neck. Denies any chest pain or shortness of breath.  He is currently taking his prednisone taper and Norco which does help with his pain, but states he is having a difficult time working with rest.  He states he works at a [employer] facility.  At times he feels like his left shoulder will be weak, but he denies any weakness of the arm but diminished grip strength.  His pain is worse at night when he tries to lie down.  Denies any headache, lightheadedness or dizziness.   The patient's blood pressure is fine, but he states it is always been high over the last few years every time he sees a doctor.  Denies any chest pain, palpitations, or shortness of breath.   Past Medical History:  Diagnosis Date   Hypertension     There are no problems to display for this patient.   History reviewed. No pertinent surgical history.     Home Medications    Prior to Admission medications   Medication Sig Start Date End Date Taking?  Authorizing Provider  meloxicam (MOBIC) 15 MG tablet Take 1 tablet (15 mg total) by mouth daily for 10 days. 12/10/20 12/20/20 Yes Madelyn Brunner, DO  amLODipine (NORVASC) 10 MG tablet Take 1 tablet (10 mg total) by mouth daily. 12/05/20   Elpidio Anis, PA-C  HYDROcodone-acetaminophen (NORCO/VICODIN) 5-325 MG tablet Take 1 tablet by mouth every 4 (four) hours as needed. 12/05/20   Elpidio Anis, PA-C  predniSONE (DELTASONE) 10 MG tablet Take 6 tablets on days 1 and 2 Take 5 tablets on days 3 and 4 continue taper completing on day 12 12/05/20   Elpidio Anis, PA-C  hydrochlorothiazide (HYDRODIURIL) 12.5 MG tablet Take 1 tablet (12.5 mg total) by mouth daily. 02/13/18 04/02/20  Zadie Rhine, MD    Family History History reviewed. No pertinent family history.  Social History Social History   Tobacco Use   Smoking status: Every Day    Packs/day: 0.50    Types: Cigars, Cigarettes   Smokeless tobacco: Never  Substance Use Topics   Alcohol use: No   Drug use: Not Currently     Allergies   Patient has no known allergies.   Review of Systems Review of Systems  Constitutional:  Negative for chills and fever.  Respiratory:  Negative for shortness of breath.   Cardiovascular:  Negative for chest pain and palpitations.  Musculoskeletal:  Positive for back pain, neck pain and neck stiffness.  Skin:  Negative for pallor and rash.  Neurological:  Negative for dizziness and weakness.    Physical Exam Triage Vital Signs ED Triage Vitals  Enc Vitals Group     BP 12/10/20 1954 (!) 164/89     Pulse Rate 12/10/20 1954 76     Resp --      Temp 12/10/20 1954 98.7 F (37.1 C)     Temp src --      SpO2 12/10/20 1954 100 %     Weight --      Height --      Head Circumference --      Peak Flow --      Pain Score 12/10/20 1956 10     Pain Loc --      Pain Edu? --      Excl. in GC? --    No data found.  Updated Vital Signs BP (!) 164/89   Pulse 76   Temp 98.7 F (37.1 C)   SpO2  100%   Physical Exam Gen: Well-appearing, in no acute distress; non-toxic CV: Regular Rate. Well-perfused. Warm.  Resp: Breathing unlabored on room air; no wheezing. Psych: Fluid speech in conversation; appropriate affect; normal thought process Neuro: Sensation intact throughout. No gross coordination deficits.  MSK:  - Cervical Spine: Patient's head is held in a slightly flexed position; no erythema, ecchymosis or malalignment noted.  There is significant tenderness to palpation over the left sternocleidomastoid muscle into the trapezius, there is hypertonicity over this area.  He does have somewhat limited rotation and extension of the left neck.  Negative Spurling's.  There is no spinous tenderness process palpated in the cervical spine.  5/5 strength of bilateral upper extremities.  Grip strength equivocal bilaterally.  Gross sensation to light touch intact bilaterally.    UC Treatments / Results  Labs (all labs ordered are listed, but only abnormal results are displayed) Labs Reviewed - No data to display  EKG   Radiology DG Chest 2 View CLINICAL DATA:  Chest pain  EXAM: CHEST - 2 VIEW  COMPARISON:  04/29/2018  FINDINGS: Lungs are clear.  No pleural effusion or pneumothorax.  The heart is normal in size.  Visualized osseous structures are within normal limits.  IMPRESSION: Normal chest radiographs.  Electronically Signed   By: Charline Bills M.D.   On: 12/05/2020 02:37    Procedures Procedures (including critical care time)  Medications Ordered in UC Medications - No data to display  Initial Impression / Assessment and Plan / UC Course  I have reviewed the triage vital signs and the nursing notes.  Pertinent labs & imaging results that were available during my care of the patient were reviewed by me and considered in my medical decision making (see chart for details).    Neck pain Cervical strain with radiculopathy - subsequent  encounter Pretension  Patient was seen for reevaluation of his neck pain today, previously seen in the ED on 7/20 with negative x-rays of the chest.  His neck pain is improving, however he still feels he needs an additional few days before returning to work, he is inquiring for a work excuse note today. I did provide this today. He does not have any spinous process or bony tenderness, his pain is more so in the left paraspinal muscles and trapezius muscles.  The pain radiates into the shoulder, but he does not have any radicular symptoms down  the lower extremity or into the digits/hand.  No numbness tingling or weakness. He Does have hypertonicity of his left paraspinal muscles and SCM.  Currently taking and completing his prednisone taper.  I did start him on a short course of anti-inflammatories, Mobic to be taken once daily for 7 days.  Even though his blood pressure is elevated, upon chart review it seems elevated chronically and he does have a history of medication noncompliance.  I do feel like his pain may be elevating his blood pressure acutely.  We Discussed he should only take it for 1 week scheduled, and then only as needed but in general to limit NSAID activity.  He is to follow-up with primary care.  I did provide information for on-call orthopedics, [facility] for him to make an appointment if this is still bothering him.  Return precautions provided.  Final Clinical Impressions(s) / UC Diagnoses   Final diagnoses:  Cervical strain, acute, subsequent encounter  Cervical radiculitis  Essential hypertension     Discharge Instructions      Ice/Heat the neck  Take Mobic once daily with food (do not take other anti-inflammatories when taking this--no motrin, ibuprofen, aleve. You may take tylenol as needed)       ED Prescriptions     Medication Sig Dispense Auth. Provider   meloxicam (MOBIC) 15 MG tablet Take 1 tablet (15 mg total) by mouth daily for 10 days. 20 tablet  Madelyn Brunner, DO      PDMP not reviewed this encounter.   Madelyn Brunner, [place] 12/10/20 2038

## 2020-12-10 NOTE — ED Triage Notes (Signed)
Pt reports his neck pain makes it hard to sleep ,hard to drive . Pt went to ED 7-20- 22 . Pt was given RX -for pain . Pt wants more days off from work.

## 2020-12-10 NOTE — Discharge Instructions (Signed)
Ice/Heat the neck  Take Mobic once daily with food (do not take other anti-inflammatories when taking this--no motrin, ibuprofen, aleve. You may take tylenol as needed)

## 2020-12-17 ENCOUNTER — Encounter (HOSPITAL_COMMUNITY): Payer: Self-pay | Admitting: Emergency Medicine

## 2020-12-17 ENCOUNTER — Other Ambulatory Visit: Payer: Self-pay

## 2020-12-17 ENCOUNTER — Ambulatory Visit (HOSPITAL_COMMUNITY)
Admission: EM | Admit: 2020-12-17 | Discharge: 2020-12-17 | Disposition: A | Discharge status: Home / Self Care | Payer: Self-pay

## 2020-12-17 DIAGNOSIS — M5412 Radiculopathy, cervical region: Secondary | ICD-10-CM

## 2020-12-17 NOTE — ED Provider Notes (Signed)
[facility]    CSN: [id number] Arrival date & time: 12/17/20  1453      History   Chief Complaint Chief Complaint  Patient presents with   Back Pain    HPI Ricardo Caldwell is a 35 y.o. male.   Patient here for re-evaluation of neck and back pain.  Patient has been seen twice for the same.  Initially prescribed hydrocodone-acetaminophen and prednisone.  At second visit patient was prescribed meloxicam.  Reports taking hydrocodone-acetaminophen but has not started prednisone or meloxicam.  Reports using heat and warm showers to help with pain.  Patient has appointment with sports medicine on Friday.  Denies any trauma, injury, or other precipitating event.  Denies any specific alleviating or aggravating factors.  Denies any fevers, chest pain, shortness of breath, N/V/D, numbness, tingling, weakness, abdominal pain, or headaches.     The history is provided by the patient.  Back Pain  Past Medical History:  Diagnosis Date   Hypertension     There are no problems to display for this patient.   History reviewed. No pertinent surgical history.     Home Medications    Prior to Admission medications   Medication Sig Start Date End Date Taking? Authorizing Provider  amLODipine (NORVASC) 10 MG tablet Take 1 tablet (10 mg total) by mouth daily. 12/05/20   Elpidio Anis, PA-C  HYDROcodone-acetaminophen (NORCO/VICODIN) 5-325 MG tablet Take 1 tablet by mouth every 4 (four) hours as needed. 12/05/20   Elpidio Anis, PA-C  meloxicam (MOBIC) 15 MG tablet Take 1 tablet (15 mg total) by mouth daily for 10 days. 12/10/20 12/20/20  Madelyn Brunner, DO  predniSONE (DELTASONE) 10 MG tablet Take 6 tablets on days 1 and 2 Take 5 tablets on days 3 and 4 continue taper completing on day 12 12/05/20   Elpidio Anis, PA-C  hydrochlorothiazide (HYDRODIURIL) 12.5 MG tablet Take 1 tablet (12.5 mg total) by mouth daily. 02/13/18 04/02/20  Zadie Rhine, MD    Family History History reviewed.  No pertinent family history.  Social History Social History   Tobacco Use   Smoking status: Every Day    Packs/day: 0.50    Types: Cigars, Cigarettes   Smokeless tobacco: Never  Substance Use Topics   Alcohol use: No   Drug use: Not Currently     Allergies   Patient has no known allergies.   Review of Systems Review of Systems  Musculoskeletal:  Positive for back pain.  All other systems reviewed and are negative.   Physical Exam Triage Vital Signs ED Triage Vitals  Enc Vitals Group     BP 12/17/20 1631 (!) 156/100     Pulse Rate 12/17/20 1631 74     Resp 12/17/20 1631 16     Temp 12/17/20 1631 98.9 F (37.2 C)     Temp Source 12/17/20 1631 Oral     SpO2 12/17/20 1631 98 %     Weight --      Height --      Head Circumference --      Peak Flow --      Pain Score 12/17/20 1629 8     Pain Loc --      Pain Edu? --      Excl. in GC? --    No data found.  Updated Vital Signs BP (!) 156/100 (BP Location: Right Arm)   Pulse 74   Temp 98.9 F (37.2 C) (Oral)   Resp 16   SpO2 98%  Visual Acuity Right Eye Distance:   Left Eye Distance:   Bilateral Distance:    Right Eye Near:   Left Eye Near:    Bilateral Near:     Physical Exam Vitals and nursing note reviewed.  Constitutional:      General: He is not in acute distress.    Appearance: Normal appearance. He is not ill-appearing, toxic-appearing or diaphoretic.  HENT:     Head: Normocephalic and atraumatic.  Eyes:     Conjunctiva/sclera: Conjunctivae normal.  Cardiovascular:     Rate and Rhythm: Normal rate.     Pulses: Normal pulses.  Pulmonary:     Effort: Pulmonary effort is normal.  Abdominal:     General: Abdomen is flat.  Musculoskeletal:        General: Normal range of motion.     Cervical back: Normal range of motion. Tenderness present. No swelling, edema, deformity, bony tenderness or crepitus. Pain with movement present. Normal range of motion.  Skin:    General: Skin is warm and  dry.  Neurological:     General: No focal deficit present.     Mental Status: He is alert and oriented to person, place, and time.  Psychiatric:        Mood and Affect: Mood normal.     UC Treatments / Results  Labs (all labs ordered are listed, but only abnormal results are displayed) Labs Reviewed - No data to display  EKG   Radiology No results found.  Procedures Procedures (including critical care time)  Medications Ordered in UC Medications - No data to display  Initial Impression / Assessment and Plan / UC Course  I have reviewed the triage vital signs and the nursing notes.  Pertinent labs & imaging results that were available during my care of the patient were reviewed by me and considered in my medical decision making (see chart for details).    Assessment negative for red flags or concerns.  Cervical radiculopathy. Recommend stopping hydrocodone and start prednisone as previously prescribed.  May also try heat, ice, Icy/Hot, lidocaine patches, BioFreeze, Voltaren gel.  Follow up with sports medicine as scheduled.   Final Clinical Impressions(s) / UC Diagnoses   Final diagnoses:  Cervical radiculopathy     Discharge Instructions      Stop taking the hydrocodone.    Start taking the prednisone you were initially prescribed.   You can also try heat and ice.  Icy/Hot patches, lidocaine patches, BioFreeze, or Voltaren gel.    Follow up with sports medicine as scheduled.       ED Prescriptions   None    PDMP not reviewed this encounter.   Ivette Loyal, NP 12/17/20 1710

## 2020-12-17 NOTE — ED Triage Notes (Signed)
Pt presents for follow-up for back pain after being seen on 12/10/20. States pain has gotten worse.

## 2020-12-17 NOTE — Discharge Instructions (Addendum)
Stop taking the hydrocodone.    Start taking the prednisone you were initially prescribed.   You can also try heat and ice.  Icy/Hot patches, lidocaine patches, BioFreeze, or Voltaren gel.    Follow up with sports medicine as scheduled.

## 2020-12-18 ENCOUNTER — Emergency Department (HOSPITAL_COMMUNITY)
Admission: EM | Admit: 2020-12-18 | Discharge: 2020-12-18 | Disposition: A | Discharge status: Home / Self Care | Payer: Self-pay | Attending: Emergency Medicine | Admitting: Emergency Medicine

## 2020-12-18 DIAGNOSIS — M25512 Pain in left shoulder: Secondary | ICD-10-CM | POA: Insufficient documentation

## 2020-12-18 DIAGNOSIS — Z79899 Other long term (current) drug therapy: Secondary | ICD-10-CM | POA: Insufficient documentation

## 2020-12-18 DIAGNOSIS — I10 Essential (primary) hypertension: Secondary | ICD-10-CM | POA: Insufficient documentation

## 2020-12-18 DIAGNOSIS — M5412 Radiculopathy, cervical region: Secondary | ICD-10-CM | POA: Insufficient documentation

## 2020-12-18 DIAGNOSIS — F17210 Nicotine dependence, cigarettes, uncomplicated: Secondary | ICD-10-CM | POA: Insufficient documentation

## 2020-12-18 MED ORDER — METHOCARBAMOL 500 MG PO TABS: 500.0000 mg | ORAL_TABLET | Freq: Two times a day (BID) | ORAL | 0 refills | Status: DC

## 2020-12-18 MED ORDER — METHOCARBAMOL 500 MG PO TABS
1000.0000 mg | ORAL_TABLET | Freq: Once | ORAL | Status: AC
  Administered 2020-12-18: 1000 mg via ORAL
  Filled 2020-12-18: qty 2

## 2020-12-18 NOTE — ED Provider Notes (Signed)
[facility] EMERGENCY DEPARTMENT Provider Note   CSN: [id number] Arrival date & time: 12/18/20  2330     History Chief Complaint  Patient presents with   Back Pain    Ricardo Caldwell is a 35 y.o. male presents to the emergency department with ongoing left-sided neck and shoulder pain.  His story is inconsistent.  Patient was initially evaluated on 12/05/2020 and reported nontraumatic injury.  When he was reevaluated on 12/10/2020 he reported injury to the neck and shoulder with an open door that hit him in the side of the neck and chest.  On 12/05/2020 patient had images that were without acute abnormality.  He was prescribed hydrocodone, Norvasc for his hypertension and prednisone.  Patient reports feeling the hydrocodone but not any of the other medications.  Visits on the 25th and yesterday noted patient was not taking the prednisone but continue to take hydrocodone.  He was encouraged to start the prednisone which she did last night but reports to me that this did not improve his pain.  He has also been prescribed Mobic which she is not currently taking due to financial struggles.  He has not yet followed up with orthopedics.  Hot showers and gentle stretching to help his pain.  Lying in bed seems to make it worse.  He has associated tingling in the left hand and fingers but denies any sort of weakness in the left arm.  Again this history seems to be inconsistent over the last week or so.  The history is provided by the patient and medical records. No language interpreter was used.      Past Medical History:  Diagnosis Date   Hypertension     There are no problems to display for this patient.   No past surgical history on file.     No family history on file.  Social History   Tobacco Use   Smoking status: Every Day    Packs/day: 0.50    Types: Cigars, Cigarettes   Smokeless tobacco: Never  Substance Use Topics   Alcohol use: No   Drug use: Not Currently     Home Medications Prior to Admission medications   Medication Sig Start Date End Date Taking? Authorizing Provider  methocarbamol (ROBAXIN) 500 MG tablet Take 1 tablet (500 mg total) by mouth 2 (two) times daily. 12/18/20  Yes Lissandra Keil, Dahlia Client, PA-C  amLODipine (NORVASC) 10 MG tablet Take 1 tablet (10 mg total) by mouth daily. 12/05/20   Elpidio Anis, PA-C  HYDROcodone-acetaminophen (NORCO/VICODIN) 5-325 MG tablet Take 1 tablet by mouth every 4 (four) hours as needed. 12/05/20   Elpidio Anis, PA-C  meloxicam (MOBIC) 15 MG tablet Take 1 tablet (15 mg total) by mouth daily for 10 days. 12/10/20 12/20/20  Madelyn Brunner, DO  predniSONE (DELTASONE) 10 MG tablet Take 6 tablets on days 1 and 2 Take 5 tablets on days 3 and 4 continue taper completing on day 12 12/05/20   Elpidio Anis, PA-C  hydrochlorothiazide (HYDRODIURIL) 12.5 MG tablet Take 1 tablet (12.5 mg total) by mouth daily. 02/13/18 04/02/20  Zadie Rhine, MD    Allergies    Patient has no known allergies.  Review of Systems   Review of Systems  Constitutional:  Negative for fever.  Respiratory:  Negative for shortness of breath.   Cardiovascular:  Negative for chest pain.  Gastrointestinal:  Negative for abdominal pain, nausea and vomiting.  Musculoskeletal:  Positive for myalgias and neck pain. Negative for back pain.  Skin:  Negative for rash and wound.  Neurological:  Positive for numbness. Negative for headaches.   Physical Exam Updated Vital Signs BP (!) 162/90 (BP Location: Left Arm)   Pulse (!) 57   Temp 98.9 F (37.2 C)   Resp 18   SpO2 99%   Physical Exam Vitals and nursing note reviewed.  Constitutional:      General: He is not in acute distress.    Appearance: He is well-developed. He is not ill-appearing.  HENT:     Head: Normocephalic.  Eyes:     General: No scleral icterus.    Conjunctiva/sclera: Conjunctivae normal.  Cardiovascular:     Rate and Rhythm: Normal rate.  Pulmonary:     Effort:  Pulmonary effort is normal. No respiratory distress.     Breath sounds: Normal breath sounds.  Abdominal:     General: There is no distension.     Palpations: Abdomen is soft.     Tenderness: There is no abdominal tenderness.  Musculoskeletal:     Right shoulder: Normal.     Left shoulder: Normal.     Cervical back: Pain with movement present. Decreased range of motion.     Thoracic back: Normal.     Lumbar back: Normal.     Comments: Tender to palpation along the left paraspinal muscles of the cervical spine.  No tenderness to the thoracic or lumbar spine.  Full range of motion of the bilateral upper extremities and bilateral lower extremities.  Skin:    General: Skin is warm and dry.     Capillary Refill: Capillary refill takes less than 2 seconds.  Neurological:     Mental Status: He is alert.     Comments: Sensation intact to normal touch in the bilateral upper and lower extremities.  Strength 5/5 in the bilateral upper and lower extremities.  Normal gait.  Psychiatric:        Mood and Affect: Mood normal.    ED Results / Procedures / Treatments     Procedures Procedures   Medications Ordered in ED Medications  methocarbamol (ROBAXIN) tablet 1,000 mg (1,000 mg Oral Given 12/18/20 0208)    ED Course  I have reviewed the triage vital signs and the nursing notes.  Pertinent labs & imaging results that were available during my care of the patient were reviewed by me and considered in my medical decision making (see chart for details).  Clinical Course as of 12/18/20 0213  Tue Dec 18, 2020  0146 BP(!): 162/90 [HM]    Clinical Course User Index [HM] Alaa Eyerman, Boyd Kerbs   MDM Rules/Calculators/A&P                           Patient presents with ongoing left cervical radiculopathy.  This is the fourth evaluation for his symptoms.  Stories are inconsistent and it is unclear to me exactly what the patient is and is not taking.  I have encouraged him to stop taking  hydrocodone, start taking the prednisone, Mobic.  I have also prescribed Robaxin.  Patient reports that work makes his symptoms worse.  I have written him a work note.  Additionally he is requesting a note for court on Wednesday however given exam I feel he is physically capable of attending court.  Discussed close follow-up with orthopedics and reasons to return to the emergency department.  Patient states understanding and is in agreement with the plan.   Final Clinical Impression(s) / ED  Diagnoses Final diagnoses:  Cervical radiculopathy    Rx / DC Orders ED Discharge Orders          Ordered    methocarbamol (ROBAXIN) 500 MG tablet  2 times daily        12/18/20 0204             Reakwon Barren, Dahlia Client, PA-C 12/18/20 9509    Gilda Crease, MD 12/18/20 0630

## 2020-12-18 NOTE — ED Triage Notes (Signed)
Pt c/o continued back pain after being seen here 7/20, 7/25, at [facility] 8/1. Pt states he has no insurance, has to "pick & choose" what medications he fills at the pharmacy, has affected his ability to get better. States he's taking percocet, prednisone, robaxin. Also states he's worried about his HTN, hx of same.

## 2020-12-21 ENCOUNTER — Ambulatory Visit (INDEPENDENT_AMBULATORY_CARE_PROVIDER_SITE_OTHER): Payer: Self-pay | Admitting: Family Medicine

## 2020-12-21 ENCOUNTER — Other Ambulatory Visit: Payer: Self-pay

## 2020-12-21 VITALS — BP 172/108 | Ht 70.0 in | Wt 240.0 lb

## 2020-12-21 DIAGNOSIS — M542 Cervicalgia: Secondary | ICD-10-CM

## 2020-12-21 NOTE — Progress Notes (Signed)
  Ricardo Caldwell - 35 y.o. male MRN [id number]  Date of birth: 28-Nov-1985    SUBJECTIVE:      Chief Complaint:/ HPI:  Left sided neck and posterior shoulder pain since injury with door sometime in July.  He declines to give me any more details of injury or mechanism of accident.Has been seen several times at urgent care. Pain is sharp, worse with motions of neck such as turning laterally. No arm weakness. Sometimes the pain radiates to his anterior chest muscles, other times not. Has been written out of work and feels he cannot go back at this time. He "lifts boxes" at work.    OBJECTIVE: BP (!) 172/108   Ht 5\' 10"  (1.778 m)   Wt 240 lb (108.9 kg)   BMI 34.44 kg/m   Physical Exam:  Vital signs are reviewed. GEN WD WN NAD Shoulder symmetrical. Rotator cuff muscles strength intact in all planes bilaterally. When is distracted, strength testing of C4-C5-C6 is symmetrical with that of left.  Trapezius muscles intact, no defect, no spasm, no atrophy bilaterally. NECK He refuses to rotate his head to left side due to pain but is able to perform maneuver later in exam when he turns to answer a question. Resists forward flexion  as he says it increases his pain but negative Spurlings bilaterally  ASSESSMENT & PLAN:  See problem based charting & AVS for pt instructions. No problem-specific Assessment & Plan notes found for this encounter.  Complaint of neck pain. Inconsistent story Inconsistent exam Reviewed prior notes I asked about X rays and offered setting them up. His main concern was an extension on his out of work note. He tells me he has had x rays at ED but none are in the sytem. I tell him he has nothing on exam that would concern me for him returning to work. He is hesitant to give me details about his work but says he "lifts boxes". I offered to give him a note explaining he is able to work with no restrictions and he tells me "there is no need for that type of documentation".  After initially leaving he came back and requested x rays so order has been placed. I will notify him of any results. Doubt it will change management as exam does not have medically significant deficits.

## 2020-12-21 NOTE — Patient Instructions (Signed)
You have some muscular strain and soreness in your left neck shoulder area.  You do not have any rotator cuff damage on exam.  He have intact sensation.  You have full strength.  I know you are still having some muscular pain but I do not think there is any limitation that would prevent you from working.  In my opinion you are cleared to return to work without restriction.

## 2021-01-04 ENCOUNTER — Ambulatory Visit (HOSPITAL_COMMUNITY)
Admission: EM | Admit: 2021-01-04 | Discharge: 2021-01-04 | Disposition: A | Discharge status: Home / Self Care | Payer: Self-pay

## 2021-01-04 ENCOUNTER — Other Ambulatory Visit: Payer: Self-pay

## 2021-04-16 IMAGING — US ULTRASOUND OF SCROTUM
1 series · 14 of 25 positions shown · non-contrast
Comparison: None.

CLINICAL DATA: Acute right testicular pain and swelling.

EXAM:
SCROTAL ULTRASOUND
DOPPLER ULTRASOUND OF THE TESTICLES
TECHNIQUE: Complete ultrasound examination of the testicles, epididymis, and
other scrotal structures was performed. Color and spectral Doppler
ultrasound were also utilized to evaluate blood flow to the
testicles.

[Series 1: ultrasound of scrotum · 0.08mm/px · 14 of 56 slices shown]
[im 1/56]
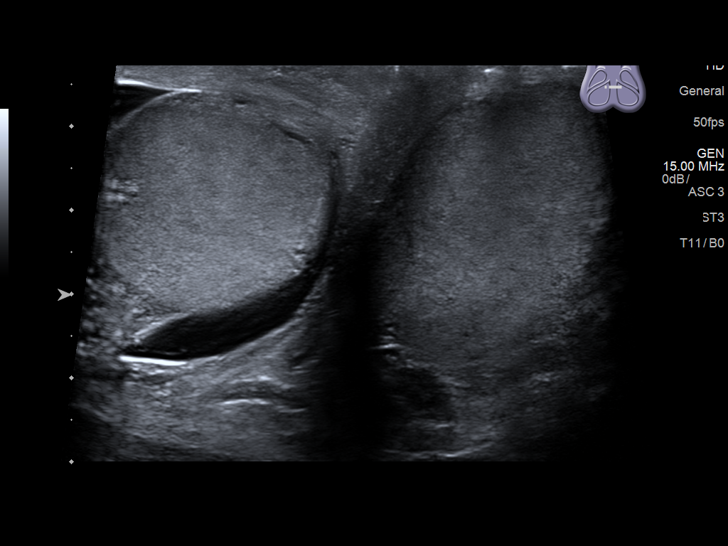
[im 5/56]
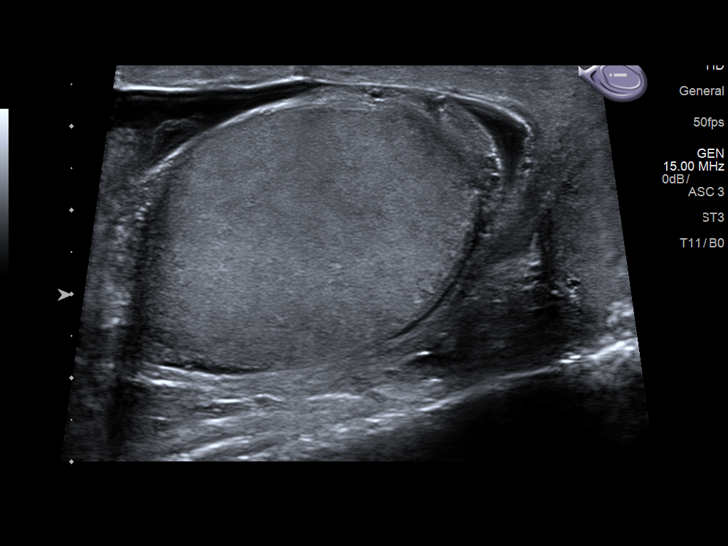
[im 10/56]
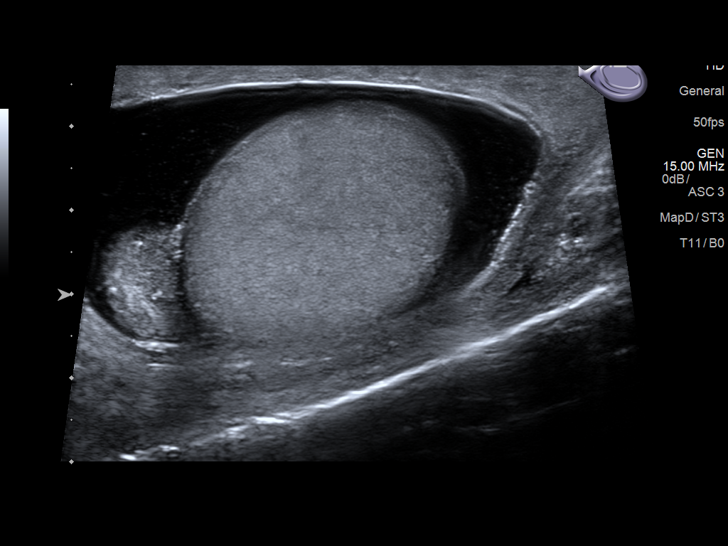
[im 14/56]
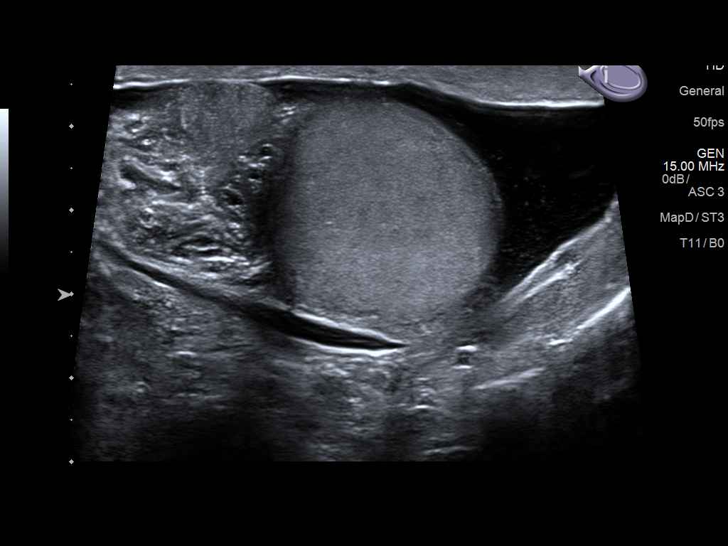
[im 19/56]
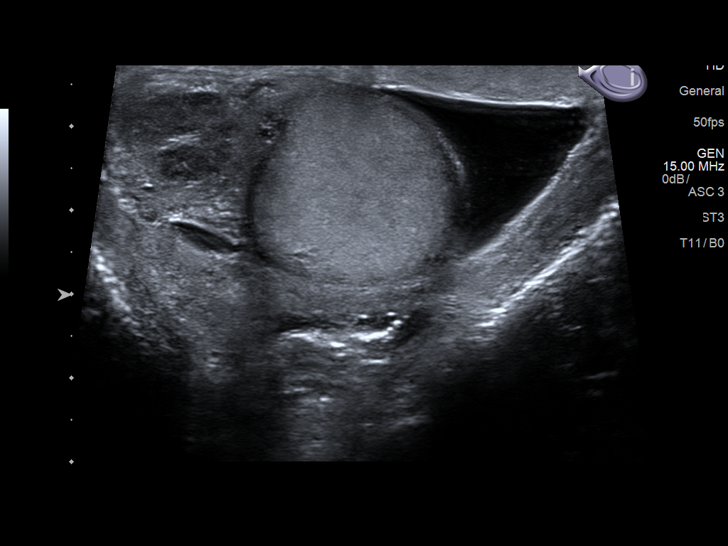
[im 21/56]
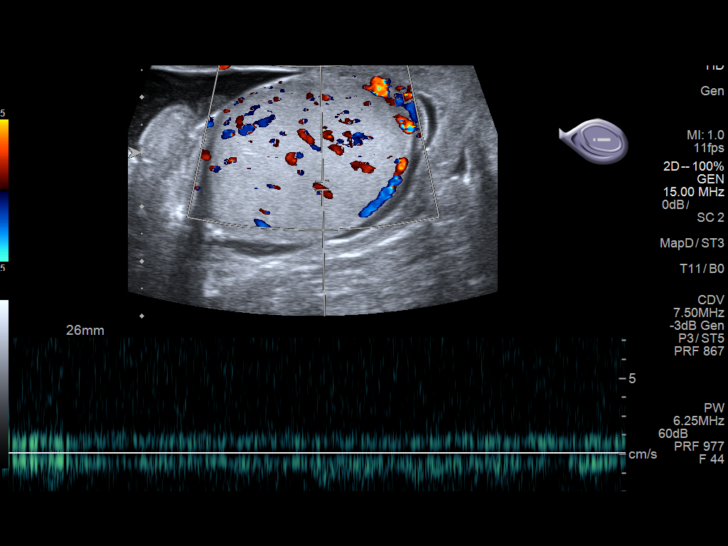
[im 26/56]
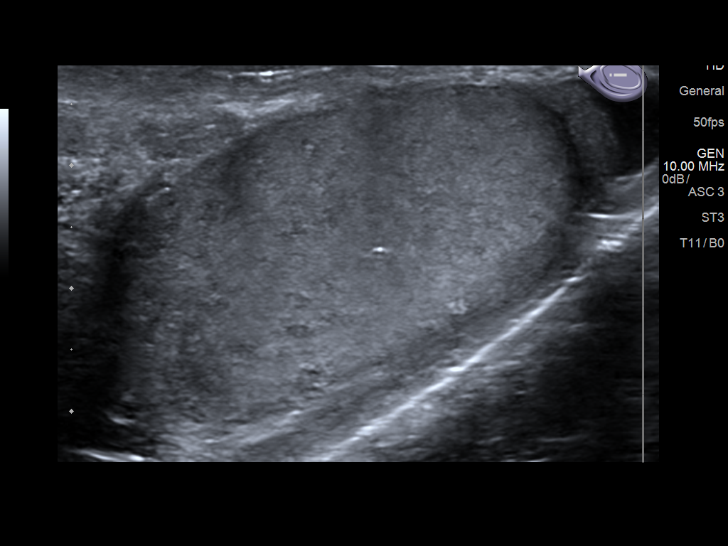
[im 30/56]
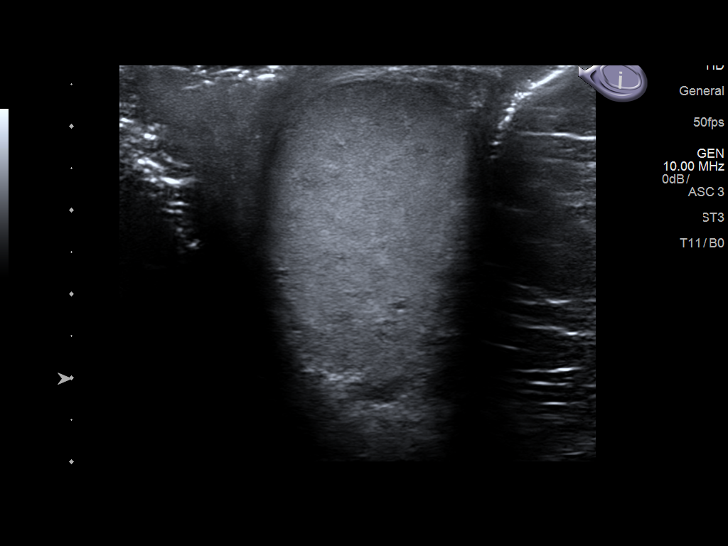
[im 35/56]
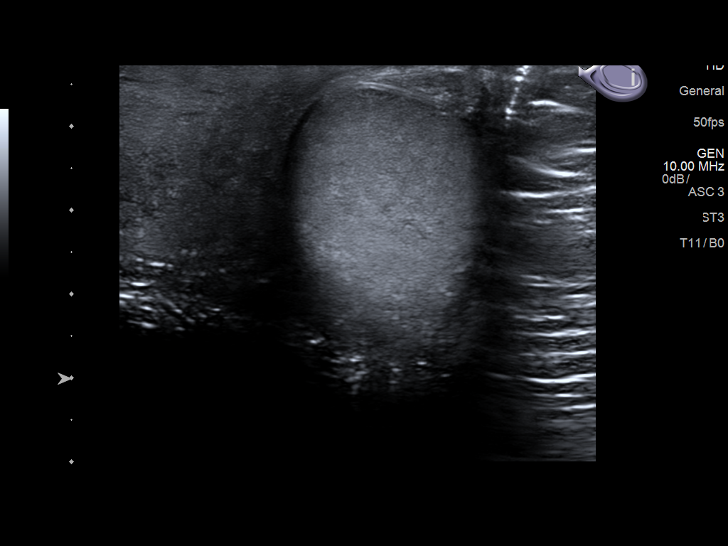
[im 37/56]
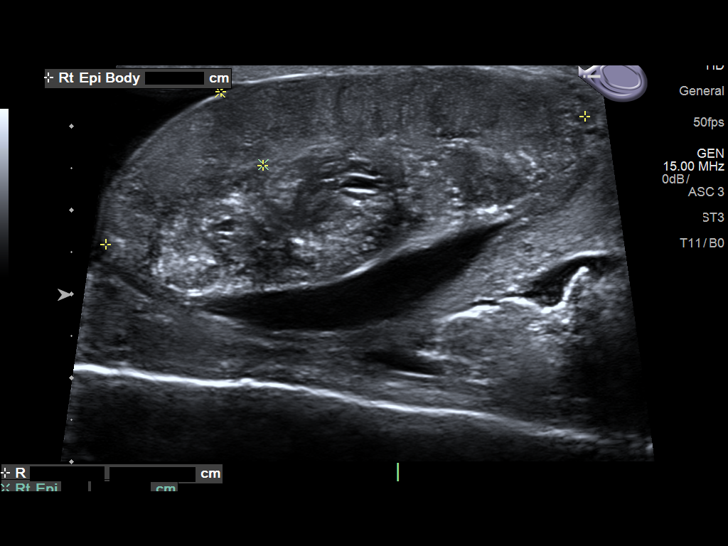
[im 42/56]
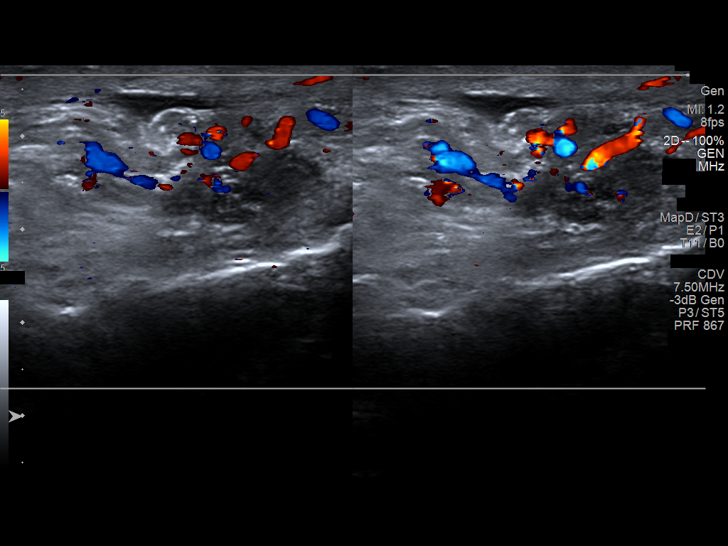
[im 46/56]
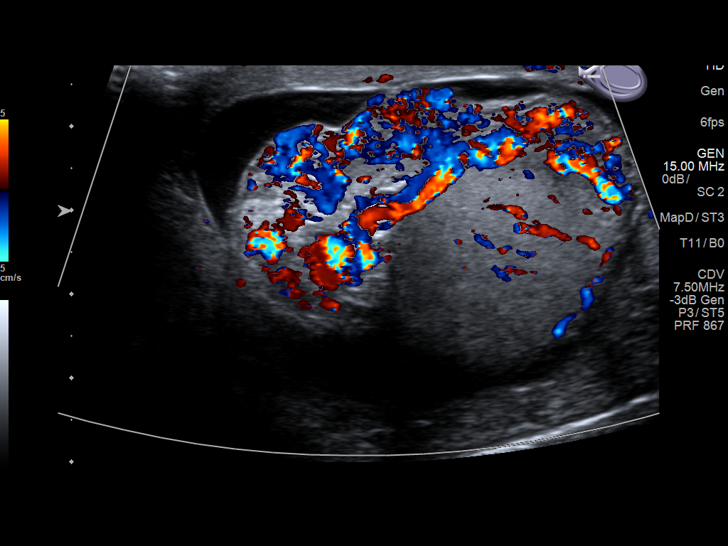
[im 51/56]
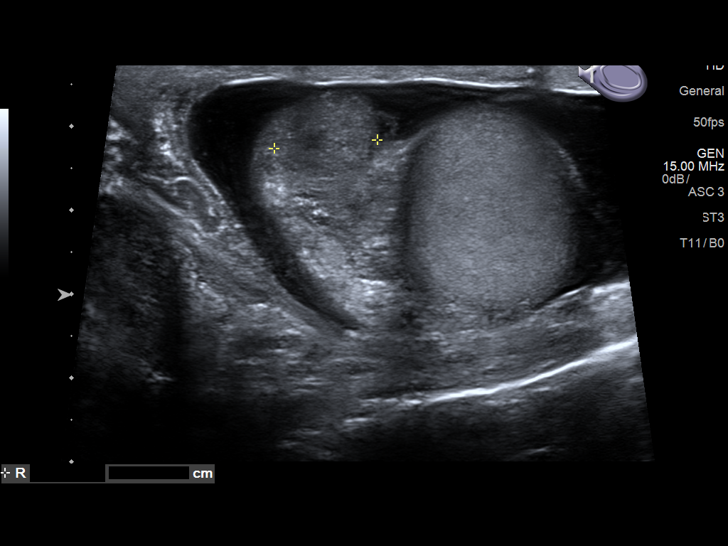
[im 56/56]
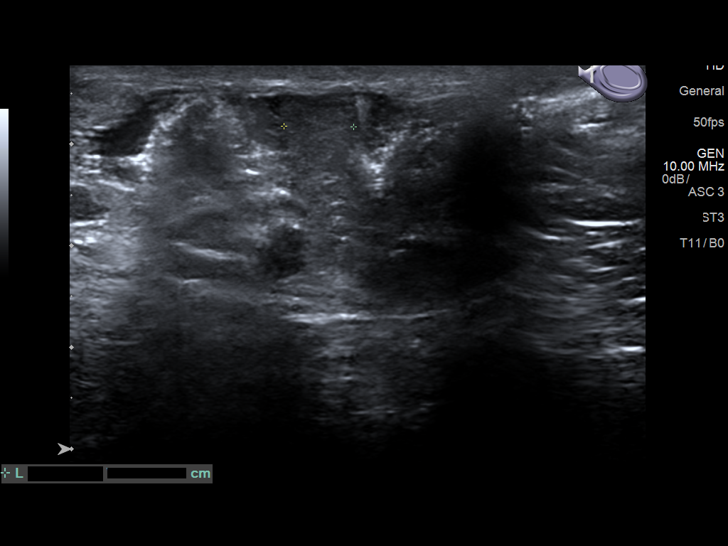

[14 of 25 positions shown; findings below may reference images not displayed]

FINDINGS: Right testicle

Measurements: 4.2 x 4.0 x 2.9 cm. No mass or microlithiasis
visualized.

Left testicle

Measurements: 4.0 x 3.6 x 2.4 cm. No mass or microlithiasis
visualized.

Right epididymis: Enlarged and hypervascular suggesting
epididymitis.

Left epididymis:  Normal in size and appearance.

Hydrocele:  Moderate size right hydrocele is noted.

Varicocele:  None visualized.

Pulsed Doppler interrogation of both testes demonstrates normal low
resistance arterial and venous waveforms bilaterally.
IMPRESSION: Findings consistent with right-sided epididymitis. Moderate
right-sided hydrocele is noted.

## 2022-02-05 IMAGING — DX DG CHEST 2V
2 series · 2 of 2 positions shown · non-contrast
Comparison: 04/29/2018

CLINICAL DATA: Chest pain

EXAM:
CHEST - 2 VIEW

[chest pa]
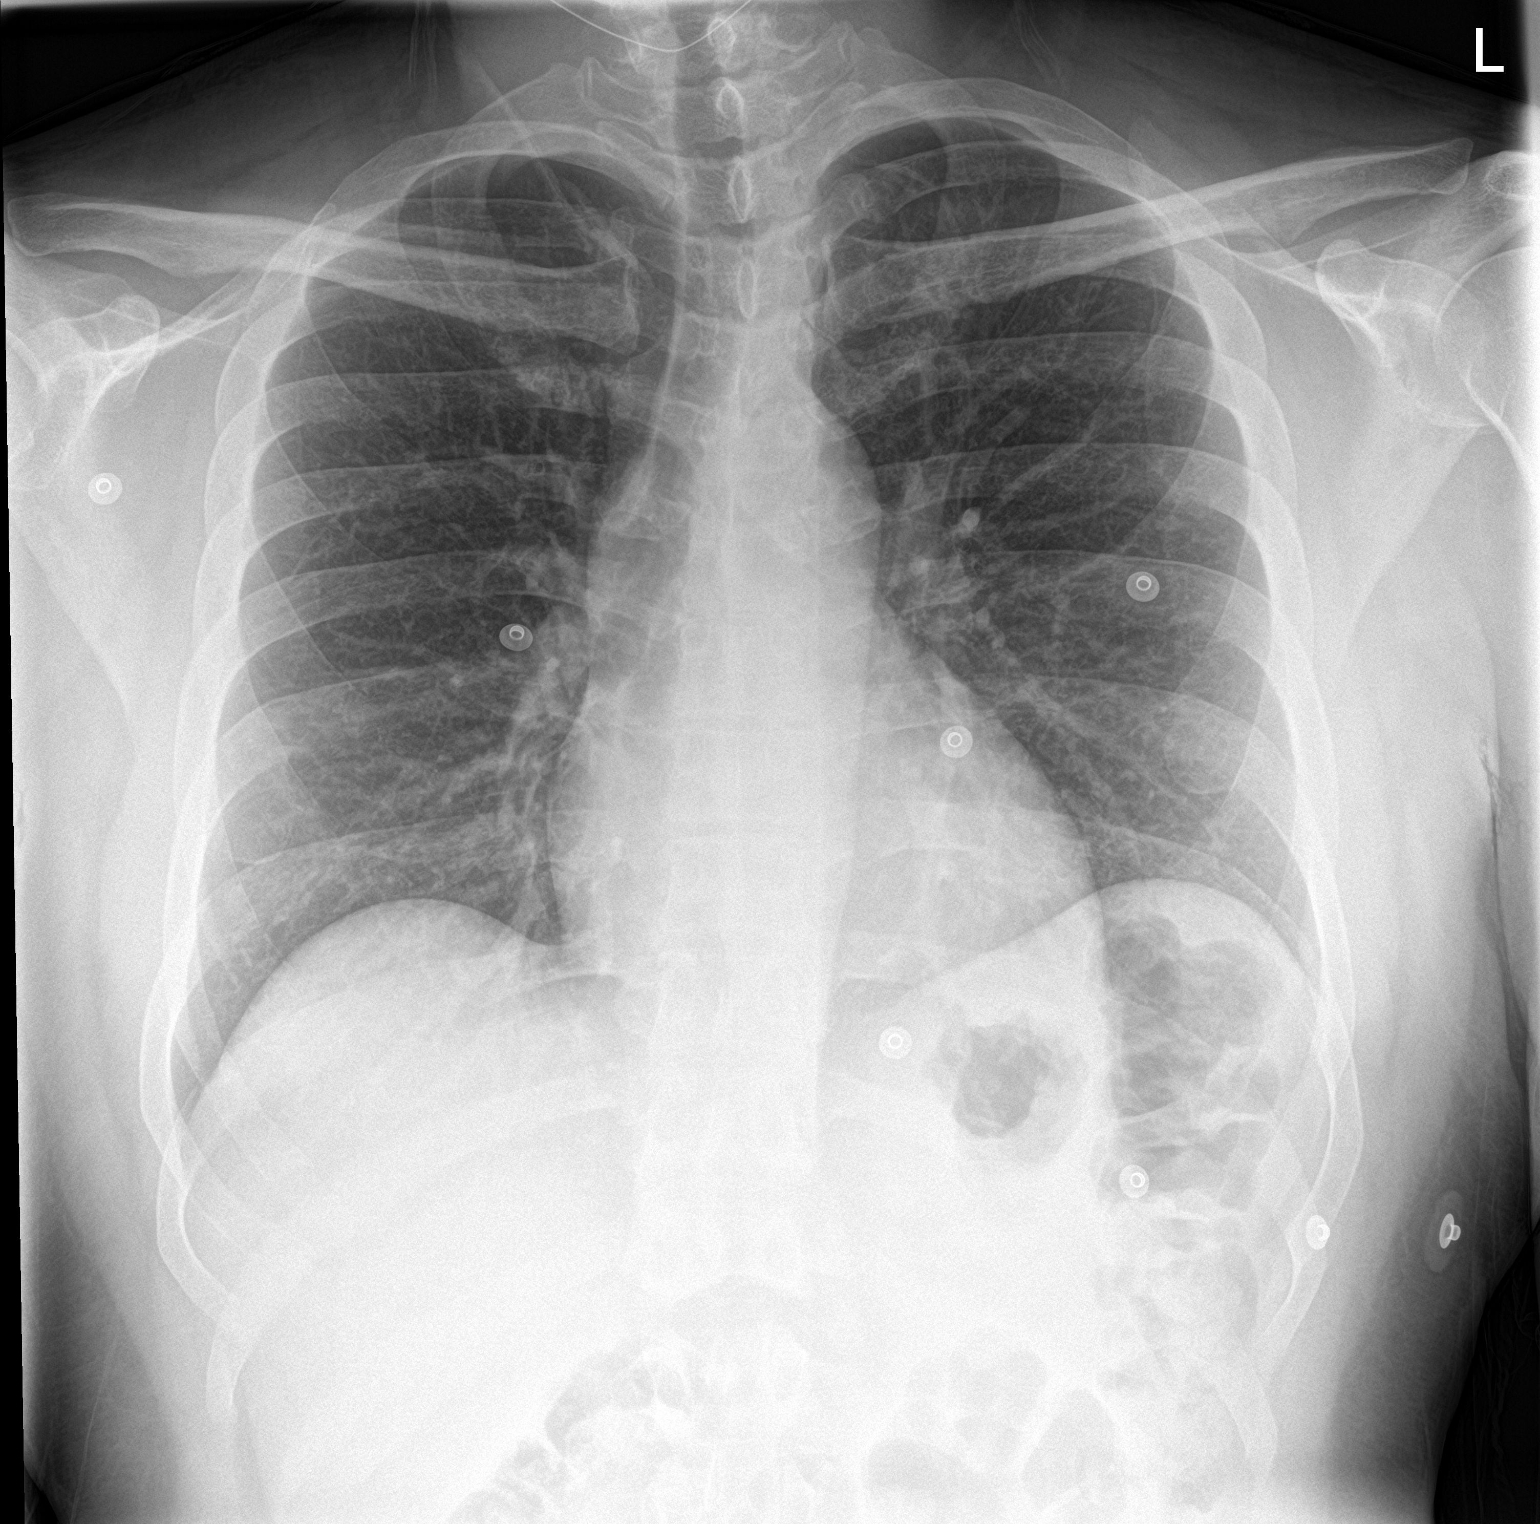

[chest lat]
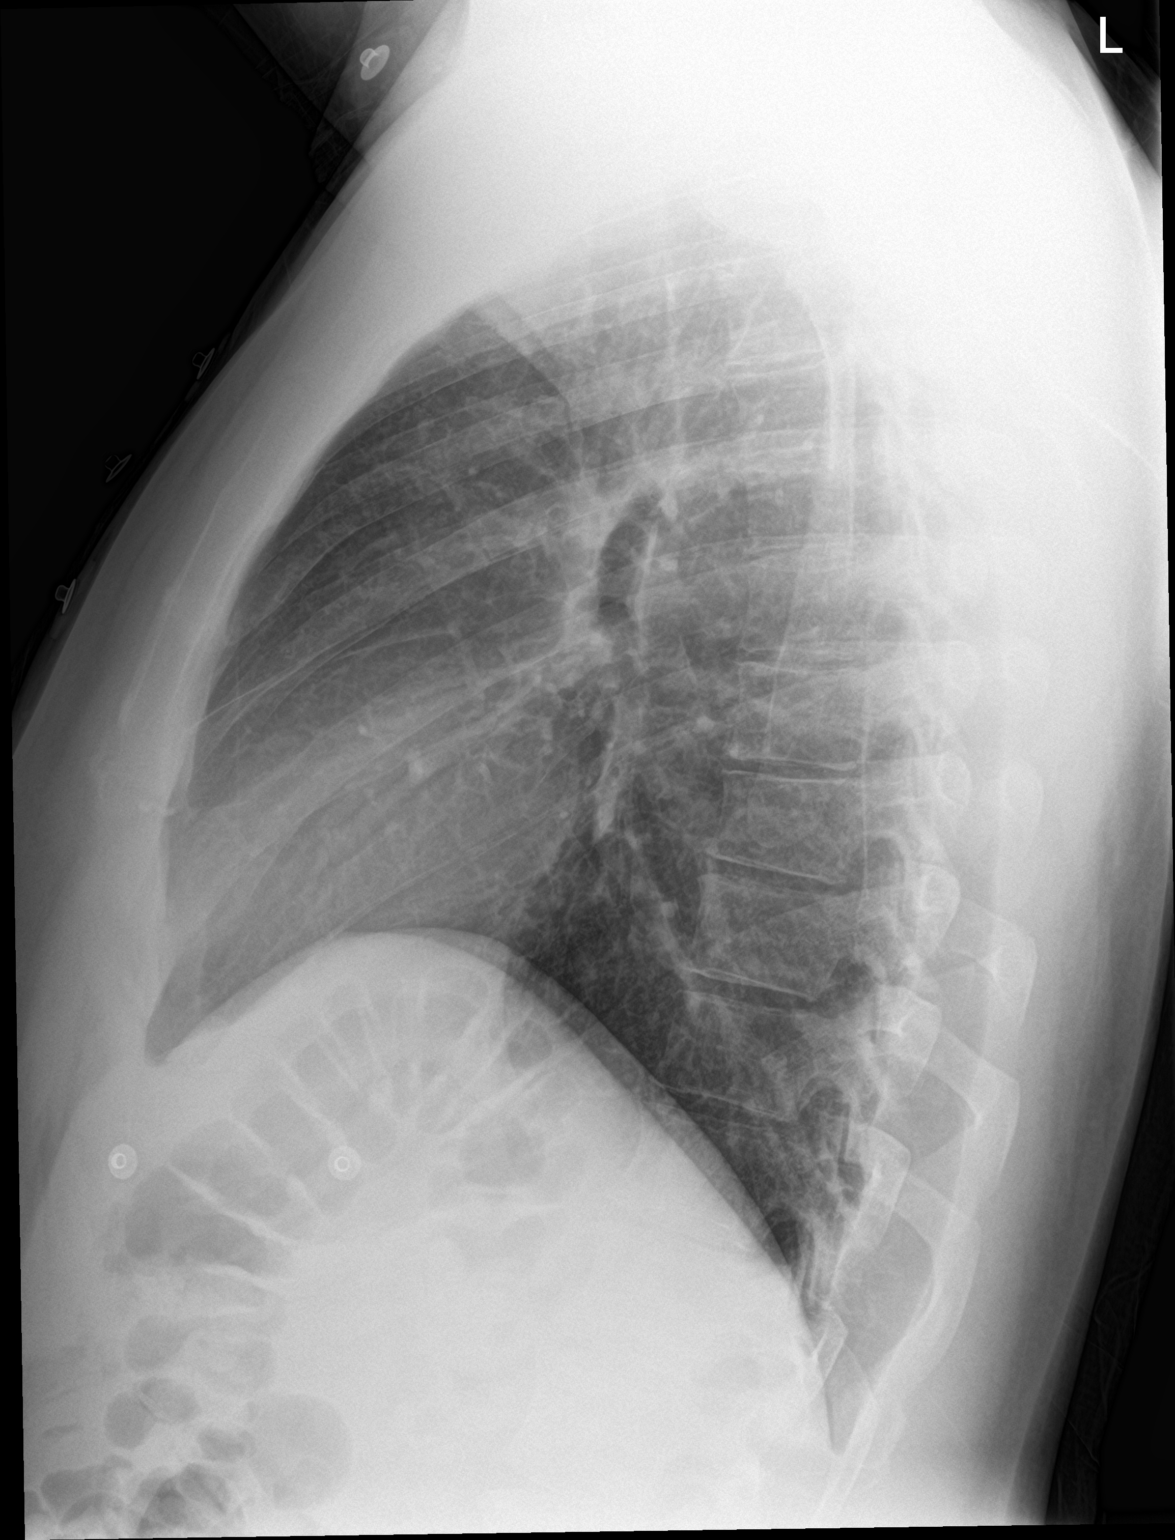

[2 of 2 positions shown; findings below may reference images not displayed]

FINDINGS: Lungs are clear.  No pleural effusion or pneumothorax.

The heart is normal in size.

Visualized osseous structures are within normal limits.
IMPRESSION: Normal chest radiographs.

## 2022-04-08 NOTE — Progress Notes (Deleted)
[facility] Initial Assessment:    Date:  04/08/2022   ID:  Ricardo Caldwell, DOB 1985-12-17, MRN [id number]  PCP:  Patient, No Pcp Per  Cardiologist:  None  Nephrologist:  Referring MD: No ref. provider found   CC: Hypertension  History of Present Illness:    Ricardo Caldwell is a 36 y.o. male with a hx of hypertension, cervial radiculopathy, *** here to establish care in the [facility].   Ricardo Caldwell was diagnosed with hypertension ***. It has been {Blank single:19197::"easy","difficult","***"} to control. Blood pressure {Blank single:19197::"not checked routinely at home","checked with arm cuff at home","checked with wrist cuff at home","***"}. Readings have been ***. he reports tobacco use ***. Alcohol use ***. For exercise he ***. he eats {Blank multiple:19196::"***","at home","outside of the home"} and {Blank single:19197::"does","does not"} follow low sodium diet.   Previous antihypertensives:  Secondary Causes of Hypertension  Medications/Herbal: OCP, steroids, stimulants, antidepressants, weight loss medication, immune suppressants, NSAIDs, sympathomimetics, alcohol, caffeine, licorice, ginseng, St. John's wort, chemo  Sleep Apnea Renal artery stenosis Hyperaldosteronism Hyper/hypothyroidism Pheochromocytoma: palpitations, tachycardia, headache, diaphoresis (plasma metanephrines) Cushing's syndrome: Cushingoid facies, central obesity, proximal muscle weakness, and ecchymoses, adrenal incidentaloma (cortisol) Coarctation of the aorta  Past Medical History:  Diagnosis Date   Hypertension     No past surgical history on file.  Current Medications: No outpatient medications have been marked as taking for the 04/09/22 encounter (Appointment) with Alver Sorrow, NP.     Allergies:   Patient has no known allergies.   Social History   Socioeconomic History   Marital status: Single    Spouse name: Not on file   Number of  children: Not on file   Years of education: Not on file   Highest education level: Not on file  Occupational History   Not on file  Tobacco Use   Smoking status: Every Day    Packs/day: 0.50    Types: Cigars, Cigarettes   Smokeless tobacco: Never  Substance and Sexual Activity   Alcohol use: No   Drug use: Not Currently   Sexual activity: Not on file  Other Topics Concern   Not on file  Social History Narrative   Not on file   Social Determinants of Health   Financial Resource Strain: Not on file  Food Insecurity: Not on file  Transportation Needs: Not on file  Physical Activity: Not on file  Stress: Not on file  Social Connections: Not on file     Family History: The patient's ***family history is not on file.  ROS:   Please see the history of present illness.    *** All other systems reviewed and are negative.  EKGs/Labs/Other Studies Reviewed:    EKG:  EKG is  ordered today.  The ekg ordered today demonstrates ***  Recent Labs: No results found for requested labs within last 365 days.   Recent Lipid Panel No results found for: "CHOL", "TRIG", "HDL", "CHOLHDL", "VLDL", "LDLCALC", "LDLDIRECT"  Physical Exam:   VS:  There were no vitals taken for this visit. , BMI There is no height or weight on file to calculate BMI. GENERAL:  Well appearing HEENT: Pupils equal round and reactive, fundi not visualized, oral mucosa unremarkable NECK:  No jugular venous distention, waveform within normal limits, carotid upstroke brisk and symmetric, no bruits, no thyromegaly LYMPHATICS:  No cervical adenopathy LUNGS:  Clear to auscultation bilaterally HEART:  RRR.  PMI not displaced or sustained,S1 and S2 within normal limits, no S3, no  S4, no clicks, no rubs, *** murmurs ABD:  Flat, positive bowel sounds normal in frequency in pitch, no bruits, no rebound, no guarding, no midline pulsatile mass, no hepatomegaly, no splenomegaly EXT:  2 plus pulses throughout, no edema, no  cyanosis no clubbing SKIN:  No rashes no nodules NEURO:  Cranial nerves II through XII grossly intact, motor grossly intact throughout PSYCH:  Cognitively intact, oriented to person place and time   ASSESSMENT/PLAN:    Hypertension - ***  Screening for Secondary Hypertension: { Click here to document screening for secondary causes of HTN  :[id number]}    Relevant Labs/Studies:    Latest Ref Rng & Units 12/05/2020    2:20 AM 04/27/2018   11:24 PM 04/23/2018   12:27 AM  Basic Labs  Sodium 135 - 145 mmol/L 138  139  140   Potassium 3.5 - 5.1 mmol/L 3.4  3.3  3.4   Creatinine 0.61 - 1.24 mg/dL 2.63  7.85  8.85                     he consents to be monitored in our remote patient monitoring program through [employer].  he will track his blood pressure twice daily and understands that these trends will help [place] to adjust his medications as needed prior to his next appointment.  he *** interested in enrolling in the PREP exercise and nutrition program through the [facility].     Disposition:    FU with MD/PharmD in {gen number [id number]} {Days to years:10300}    Medication Adjustments/Labs and Tests Ordered: Current medicines are reviewed at length with the patient today.  Concerns regarding medicines are outlined above.  No orders of the defined types were placed in this encounter.  No orders of the defined types were placed in this encounter.    Signed, Alver Sorrow, NP  04/08/2022 9:51 PM    [facility]

## 2022-04-09 ENCOUNTER — Ambulatory Visit (HOSPITAL_BASED_OUTPATIENT_CLINIC_OR_DEPARTMENT_OTHER): Payer: Self-pay | Admitting: Family

## 2024-05-23 ENCOUNTER — Encounter (HOSPITAL_COMMUNITY): Payer: Self-pay

## 2024-05-23 ENCOUNTER — Emergency Department (HOSPITAL_COMMUNITY)

## 2024-05-23 ENCOUNTER — Other Ambulatory Visit: Payer: Self-pay

## 2024-05-23 ENCOUNTER — Emergency Department (HOSPITAL_COMMUNITY)
Admission: EM | Admit: 2024-05-23 | Discharge: 2024-05-24 | Disposition: A | Discharge status: Home / Self Care | Attending: Emergency Medicine | Admitting: Emergency Medicine

## 2024-05-23 DIAGNOSIS — S0081XA Abrasion of other part of head, initial encounter: Secondary | ICD-10-CM | POA: Insufficient documentation

## 2024-05-23 DIAGNOSIS — G8911 Acute pain due to trauma: Secondary | ICD-10-CM | POA: Insufficient documentation

## 2024-05-23 DIAGNOSIS — Z79899 Other long term (current) drug therapy: Secondary | ICD-10-CM | POA: Diagnosis not present

## 2024-05-23 DIAGNOSIS — S0990XA Unspecified injury of head, initial encounter: Secondary | ICD-10-CM | POA: Diagnosis present

## 2024-05-23 DIAGNOSIS — I10 Essential (primary) hypertension: Secondary | ICD-10-CM | POA: Diagnosis not present

## 2024-05-23 DIAGNOSIS — Y92410 Unspecified street and highway as the place of occurrence of the external cause: Secondary | ICD-10-CM | POA: Insufficient documentation

## 2024-05-23 DIAGNOSIS — M546 Pain in thoracic spine: Secondary | ICD-10-CM | POA: Diagnosis not present

## 2024-05-23 DIAGNOSIS — M25561 Pain in right knee: Secondary | ICD-10-CM | POA: Diagnosis not present

## 2024-05-23 MED ORDER — ACETAMINOPHEN 500 MG PO TABS: 1000.0000 mg | ORAL_TABLET | Freq: Once | ORAL | Status: DC
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
## 2024-05-24 ENCOUNTER — Emergency Department (HOSPITAL_COMMUNITY)

## 2024-05-24 MED ORDER — METHOCARBAMOL 500 MG PO TABS: 500.0000 mg | ORAL_TABLET | Freq: Two times a day (BID) | ORAL | 0 refills | Status: AC | PRN

## 2024-05-24 MED ORDER — NAPROXEN 500 MG PO TABS: 500.0000 mg | ORAL_TABLET | Freq: Two times a day (BID) | ORAL | 0 refills | Status: AC

## 2024-05-24 MED ORDER — NAPROXEN 250 MG PO TABS
500.0000 mg | ORAL_TABLET | Freq: Once | ORAL | Status: AC
  Administered 2024-05-24: 500 mg via ORAL
  Filled 2024-05-24: qty 2
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
## 2024-05-24 NOTE — Discharge Instructions (Addendum)
 We have attached a resource guide with some primary care services listed.  We recommend follow-up with a primary care doctor for further evaluation of your symptoms.  Wear a knee sleeve for stability/comfort.  Alternate ice and heat to areas of injury 3-4 times per day to limit inflammation and spasm.  Avoid strenuous activity and heavy lifting.  We recommend consistent use of naproxen  for pain in addition to Robaxin  for muscle spasms.  Do not drive or drink alcohol after taking Robaxin  as it may make you drowsy and impair your judgment.  Return to the ED for any new or concerning symptoms.

## 2024-05-24 NOTE — Progress Notes (Signed)
 Orthopedic Tech Progress Note Patient Details:  Alamin Mccuiston [birth year] [id number]  Ortho Devices Type of Ortho Device: Knee Sleeve Ortho Device/Splint Location: rle Ortho Device/Splint Interventions: Ordered, Application, Adjustment   Post Interventions Patient Tolerated: Well Instructions Provided: Care of device, Adjustment of device  Chandra Dorn PARAS 05/24/2024, 5:36 AM
